# Patient Record
Sex: Male | Born: 1975 | Race: White | Marital: Single
Health system: Southern US, Community
[De-identification: ages and names within clinical notes are randomized; demographics above are authoritative.]

## PROBLEM LIST (undated history)

## (undated) ENCOUNTER — Ambulatory Visit (HOSPITAL_COMMUNITY): Discharge status: Absent without leave | Payer: 59

## (undated) DIAGNOSIS — M25569 Pain in unspecified knee: Secondary | ICD-10-CM

## (undated) DIAGNOSIS — N400 Enlarged prostate without lower urinary tract symptoms: Secondary | ICD-10-CM

## (undated) DIAGNOSIS — M7650 Patellar tendinitis, unspecified knee: Secondary | ICD-10-CM

## (undated) DIAGNOSIS — M542 Cervicalgia: Secondary | ICD-10-CM

## (undated) DIAGNOSIS — J330 Polyp of nasal cavity: Secondary | ICD-10-CM

## (undated) DIAGNOSIS — Z9109 Other allergy status, other than to drugs and biological substances: Secondary | ICD-10-CM

## (undated) DIAGNOSIS — M1990 Unspecified osteoarthritis, unspecified site: Secondary | ICD-10-CM

## (undated) DIAGNOSIS — G2581 Restless legs syndrome: Secondary | ICD-10-CM

## (undated) DIAGNOSIS — K589 Irritable bowel syndrome without diarrhea: Secondary | ICD-10-CM

## (undated) HISTORY — DX: Cervicalgia: M54.2

## (undated) HISTORY — PX: KNEE SURGERY: SHX244

## (undated) HISTORY — DX: Benign prostatic hyperplasia without lower urinary tract symptoms: N40.0

## (undated) HISTORY — DX: Patellar tendinitis, unspecified knee: M76.50

## (undated) HISTORY — DX: Polyp of nasal cavity: J33.0

## (undated) HISTORY — DX: Irritable bowel syndrome, unspecified: K58.9

## (undated) HISTORY — DX: Pain in unspecified knee: M25.569

## (undated) HISTORY — PX: IRRIGATION AND DEBRIDEMENT SEBACEOUS CYST: SHX5255

## (undated) HISTORY — PX: TONSILLECTOMY: SUR1361

---

## 1995-11-11 ENCOUNTER — Emergency Department: Admit: 1995-11-11 | Payer: Self-pay

## 1995-11-18 ENCOUNTER — Emergency Department: Admit: 1995-11-18 | Payer: Self-pay | Admitting: Emergency Medicine

## 1995-11-22 ENCOUNTER — Emergency Department: Admit: 1995-11-22 | Payer: Self-pay | Source: Emergency Department | Admitting: Emergency Medicine

## 1997-04-08 ENCOUNTER — Emergency Department: Admit: 1997-04-08 | Payer: Self-pay

## 2005-10-06 ENCOUNTER — Encounter: Payer: Self-pay | Admitting: Internal Medicine

## 2006-06-06 ENCOUNTER — Ambulatory Visit: Payer: Self-pay | Admitting: Family Medicine

## 2006-06-25 ENCOUNTER — Ambulatory Visit: Payer: Self-pay | Admitting: General Practice

## 2006-08-17 ENCOUNTER — Ambulatory Visit: Payer: Self-pay | Admitting: Family Medicine

## 2007-04-07 ENCOUNTER — Ambulatory Visit: Payer: Self-pay | Admitting: Family Medicine

## 2007-04-07 DIAGNOSIS — M279 Disease of jaws, unspecified: Secondary | ICD-10-CM | POA: Insufficient documentation

## 2007-06-29 HISTORY — PX: VASECTOMY: SHX75

## 2008-01-12 ENCOUNTER — Ambulatory Visit: Payer: Self-pay | Admitting: Internal Medicine

## 2008-01-12 DIAGNOSIS — J309 Allergic rhinitis, unspecified: Secondary | ICD-10-CM | POA: Insufficient documentation

## 2008-01-12 DIAGNOSIS — K589 Irritable bowel syndrome without diarrhea: Secondary | ICD-10-CM

## 2008-04-01 ENCOUNTER — Ambulatory Visit: Payer: Self-pay | Admitting: Family Medicine

## 2008-04-01 LAB — CONVERTED CEMR LAB: Rapid Strep: NEGATIVE

## 2008-07-03 ENCOUNTER — Ambulatory Visit: Payer: Self-pay | Admitting: Family Medicine

## 2008-07-03 DIAGNOSIS — M25519 Pain in unspecified shoulder: Secondary | ICD-10-CM | POA: Insufficient documentation

## 2008-07-03 DIAGNOSIS — M542 Cervicalgia: Secondary | ICD-10-CM | POA: Insufficient documentation

## 2008-07-22 ENCOUNTER — Telehealth: Payer: Self-pay | Admitting: Family Medicine

## 2008-09-05 ENCOUNTER — Ambulatory Visit: Payer: Self-pay | Admitting: Family Medicine

## 2008-12-06 ENCOUNTER — Ambulatory Visit: Payer: Self-pay | Admitting: Family Medicine

## 2008-12-06 DIAGNOSIS — B079 Viral wart, unspecified: Secondary | ICD-10-CM | POA: Insufficient documentation

## 2008-12-06 DIAGNOSIS — M25569 Pain in unspecified knee: Secondary | ICD-10-CM

## 2008-12-06 DIAGNOSIS — M7650 Patellar tendinitis, unspecified knee: Secondary | ICD-10-CM

## 2009-01-15 ENCOUNTER — Ambulatory Visit: Payer: Self-pay | Admitting: Family Medicine

## 2009-01-15 DIAGNOSIS — M62838 Other muscle spasm: Secondary | ICD-10-CM | POA: Insufficient documentation

## 2009-05-05 ENCOUNTER — Ambulatory Visit: Payer: Self-pay | Admitting: Family Medicine

## 2009-05-05 DIAGNOSIS — L0202 Furuncle of face: Secondary | ICD-10-CM

## 2009-05-05 DIAGNOSIS — L0203 Carbuncle of face: Secondary | ICD-10-CM

## 2009-06-18 ENCOUNTER — Ambulatory Visit: Payer: Self-pay | Admitting: Family Medicine

## 2009-06-18 DIAGNOSIS — J069 Acute upper respiratory infection, unspecified: Secondary | ICD-10-CM | POA: Insufficient documentation

## 2009-06-18 DIAGNOSIS — J019 Acute sinusitis, unspecified: Secondary | ICD-10-CM | POA: Insufficient documentation

## 2009-06-18 DIAGNOSIS — J330 Polyp of nasal cavity: Secondary | ICD-10-CM

## 2009-06-26 ENCOUNTER — Telehealth: Payer: Self-pay | Admitting: Family Medicine

## 2009-06-26 ENCOUNTER — Ambulatory Visit: Payer: Self-pay | Admitting: Family Medicine

## 2010-03-05 ENCOUNTER — Ambulatory Visit: Payer: Self-pay | Admitting: Internal Medicine

## 2010-07-28 NOTE — Assessment & Plan Note (Signed)
Summary: COUGH,ST,CONGESTION/CLE   Vital Signs:  Patient profile:   35 year old male Weight:      173.25 pounds Temp:     98.4 degrees F oral Pulse rate:   64 / minute Pulse rhythm:   regular BP sitting:   122 / 78  (left arm) Cuff size:   regular  Vitals Entered By: Selena Batten Dance CMA Duncan Dull) (March 05, 2010 11:35 AM) CC: Cough/congestion/sore throat   History of Present Illness: CC: cough/congestion/ST  1d h/o cough/congestion/ST.  + productive cough.  Sinus pressure/congestion.    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  No fever/chills.  No abd pain, n/v/d.   + sick contacts (both children at home)  No smokers at home.  No h/o allergies or asthma.  No new pets.  Current Medications (verified): 1)  Bl Mens One Daily   Tabs (Multiple Vitamins-Minerals) .... Take 1 Tablet By Mouth Once A Day As Needed  Allergies: 1)  ! 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 Sinuses NT. Eyes:  Conjunctiva clear bilaterally.  Ears:  TMs and canals clear and nonerythem. Nose:  External nasal examination shows no deformity or inflammation. Nasal mucosa are pink and moist without lesions or exudates. Mouth:  Oral mucosa and oropharynx without lesions or exudates.  Teeth in good repair. Neck:  No deformities, masses, or tenderness noted.  no LAD Lungs:  Normal respiratory effort, chest expands symmetrically. Lungs are clear to auscultation, no crackles or wheezes.   Heart:  Normal rate and regular rhythm. S1 and S2 normal without gallop, murmur, click, rub or other extra  sounds. Abdomen:  Bowel sounds positive,abdomen soft and non-tender without masses, organomegaly or hernias noted. Pulses:  2+ rad pulses Extremities:  No clubbing, cyanosis, edema, or deformity noted with normal full range of motion of all joints.   Skin:  Intact without suspicious lesions or rashes   Impression & Recommendations:  Problem # 1:  URI (ICD-465.9) Viral.  Instructed on symptomatic treatment. Call if symptoms persist or worsen.   red flags to return discussed.  The following medications were removed from the medication list:    Cheratussin Ac 100-10 Mg/47ml Syrp (Guaifenesin-codeine) .Marland KitchenMarland KitchenMarland KitchenMarland Kitchen 5 ml every night as needed for cough. His updated medication list for this problem includes:    Cheratussin Ac 100-10 Mg/44ml Syrp (Guaifenesin-codeine) .Marland Kitchen... 1 teaspoon q6 hours as needed cough    Fenesin Ir 400 Mg Tabs (Guaifenesin) .Marland Kitchen... Guaifenesin ir 1 tablet in am and at noon with fluids as needed cough  Complete Medication List: 1)  Bl Mens One Daily Tabs (Multiple vitamins-minerals) .... Take 1 tablet by mouth once a day as needed 2)  Cheratussin Ac 100-10 Mg/59ml Syrp (Guaifenesin-codeine) .Marland Kitchen.. 1 teaspoon q6 hours as needed cough 3)  Fenesin Ir 400 Mg Tabs (Guaifenesin) .... Guaifenesin ir 1 tablet in am and at noon with fluids as needed cough  Patient Instructions: 1)  Sounds like you have a viral upper respiratory infection. 2)  Antibiotics are not needed for this.  Viral infections usually take 7-10 days to resolve.  The cough can last up to 4-6 weeks to go away though. 3)  Use medication as  prescribed: cheratussin cough suppresant for night, guaifenesin IR for morning to help cough out mucous.  For throat you can try ibuprofen up to 600mg  every 6 hours.  For sinus congestion, consider trying neti pot or nasal saline drops to help get mucous out.  4)  Please return if you are not improving as expected, or if you have high fevers (>101.5) or difficulty swallowing. 5)  Call clinic  with questions.  Pleasure to see you today. Prescriptions: FENESIN IR 400 MG TABS (GUAIFENESIN) guaifenesin IR 1 tablet in am and at noon with fluids as needed cough  #30 x 1   Entered and Authorized by:   Eustaquio Boyden  MD   Signed by:   Eustaquio Boyden  MD on 03/05/2010   Method used:   Print then Give to Patient   RxID:   [id number] CHERATUSSIN AC 100-10 MG/5ML SYRP (GUAIFENESIN-CODEINE) 1 teaspoon q6 hours as needed cough  #100cc x 0   Entered and Authorized by:   Eustaquio Boyden  MD   Signed by:   Eustaquio Boyden  MD on 03/05/2010   Method used:   Print then Give to Patient   RxID:   [id number]   Current Allergies (reviewed today): ! PCN

## 2010-11-10 NOTE — Assessment & Plan Note (Signed)
[facility]                                 ON-CALL NOTE   DASAN, HARDMAN                           MRN:          [id number]  DATE:05/05/2009                            DOB:          January 30, 1976    Dr. Patsy Lager is regular doctor and Dr. Milinda Antis on-call.   CHIEF COMPLAINT:  Question about nasal spray.   The patient was seen by Dr. Patsy Lager today for nasal congestion and also  a polyp or sore in his nose.  He is recommended to start Flonase.  He  started the Flonase today and then noticed dry blood in the area of the  polyp in his nose.  It is a little sore but otherwise does not feel any  different.  He was given an antibiotic ointment to use also, he has not  started it yet.  I advised him that Flonase can cause more easy  nosebleed due to the fact that it is topical steroid.  I advised him to  go ahead and use his antibiotic ointment on the sore in his nose for 2  days, then to try the Flonase again.  If it causes a nosebleed or  bleeding again, to call Dr. Cyndie Chime office back for followup and  further advice.  I also invited him to call back if he has any further  questions tonight.     Marne A. Tower, MD  Electronically Signed    MAT/MedQ  DD: 05/05/2009  DT: 05/06/2009  Job #: [id number]
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
## 2011-08-17 ENCOUNTER — Telehealth: Payer: Self-pay | Admitting: Internal Medicine

## 2011-08-17 NOTE — Telephone Encounter (Signed)
Triage Record Num: [id number] Operator: Lesli Albee Patient Name: Shane [name] Call Date & Time: 08/17/2011 8:28:08AM Patient Phone: [phone] PCP: Patient Gender: Male PCP Fax : Patient DOB: [birth year] Practice Name: [facility] Reason for Call: Caller: Tamir/Father; PCP: Hannah Beat T.; CB#: [phone]; ; ; Call regarding Rectal Bleeding; Pt is calling to see if he needs an appt. After each bm for the past couple of months he has had bleeding after bms. Instead of coming in to this office he is asking the nurse if he should see a specialist. RN transferred pt to the office to speak with Aram Beecham. (not sure of protocol for this). Nurse advised pt he should be seen today. Protocol(s) Used: Gastrointestinal Bleeding Recommended Outcome per Protocol: See Provider within 4 hours Reason for Outcome: One or more episodes of rectal bleeding (more than scant) and no symptoms of hypovolemia Care Advice: ~ 08/17/2011 8:47:43AM Page 1 of 1 [id number]

## 2011-08-17 NOTE — Telephone Encounter (Signed)
Was given appt today with Dr Reece Agar

## 2011-08-19 ENCOUNTER — Encounter: Payer: Self-pay | Admitting: Internal Medicine

## 2011-08-19 ENCOUNTER — Encounter: Payer: Self-pay | Admitting: Family Medicine

## 2011-08-19 ENCOUNTER — Ambulatory Visit (INDEPENDENT_AMBULATORY_CARE_PROVIDER_SITE_OTHER): Payer: Managed Care, Other (non HMO) | Admitting: Family Medicine

## 2011-08-19 VITALS — BP 134/80 | HR 64 | Temp 98.3°F | Ht 70.0 in | Wt 176.8 lb

## 2011-08-19 DIAGNOSIS — K921 Melena: Secondary | ICD-10-CM | POA: Insufficient documentation

## 2011-08-19 LAB — HEMOGLOBIN: Hemoglobin: 16 g/dL (ref 13.0–17.0)

## 2011-08-19 LAB — POC HEMOCCULT BLD/STL (OFFICE/1-CARD/DIAGNOSTIC): Fecal Occult Blood, POC: POSITIVE

## 2011-08-19 MED ORDER — HYDROCORTISONE 2.5 % RE CREA: TOPICAL_CREAM | RECTAL | Status: DC

## 2011-08-19 NOTE — Progress Notes (Signed)
Addended by: Baldomero Lamy on: 08/19/2011 10:07 AM   Modules accepted: Orders

## 2011-08-19 NOTE — Assessment & Plan Note (Addendum)
No evidence of bleed on exam noted.  + perianal irritation Anticipate internal hemorrhoidal bleed given story. Refer to GI however given prolonged history of blood in stool. anusol for anal irritation in meantime. rec colace, discussed goal 1 soft stool/day.
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
## 2011-08-19 NOTE — Patient Instructions (Addendum)
Start anusol HC cream for irritation around bottom area. Start colace once daily (stool softener). Since this has been going on for several months, I do think it's best to set you up with stomach doctor for evaluation. Pass by Marion's office to schedule appointment. We will check spot hemoglobin today. If noticing more trouble with voiding, let us know.

## 2011-08-19 NOTE — Progress Notes (Signed)
Addended by: Eustaquio Boyden on: 08/19/2011 09:51 AM   Modules accepted: Orders

## 2011-08-24 ENCOUNTER — Telehealth: Payer: Self-pay | Admitting: *Deleted

## 2011-08-24 DIAGNOSIS — N400 Enlarged prostate without lower urinary tract symptoms: Secondary | ICD-10-CM | POA: Insufficient documentation

## 2011-08-24 NOTE — Telephone Encounter (Signed)
Ok to do - will place order and route to [place]. Prostate was slightly enlarged, + fmhx BPH.

## 2011-08-24 NOTE — Telephone Encounter (Signed)
Patient called and requested Urology appt due to enlarged prostate and family hx of same. He is concerned about it and would like to be evaluated. No current symptoms, but at your exam you said it was enlarged. He prefers Educational psychologist and will wait to hear from [place].

## 2011-08-30 ENCOUNTER — Encounter: Payer: Self-pay | Admitting: Internal Medicine

## 2011-08-31 ENCOUNTER — Encounter: Payer: Self-pay | Admitting: Internal Medicine

## 2011-08-31 ENCOUNTER — Ambulatory Visit (INDEPENDENT_AMBULATORY_CARE_PROVIDER_SITE_OTHER): Payer: Managed Care, Other (non HMO) | Admitting: Internal Medicine

## 2011-08-31 VITALS — BP 110/60 | HR 64 | Ht 70.0 in | Wt 180.0 lb

## 2011-08-31 DIAGNOSIS — K625 Hemorrhage of anus and rectum: Secondary | ICD-10-CM

## 2011-08-31 NOTE — Patient Instructions (Signed)
You have been scheduled for a Flex sig with propofol. Please follow written instructions given to you at your visit today.  Please pick up your prep kit at the pharmacy within the next 1-3 days.
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
## 2011-09-14 ENCOUNTER — Telehealth: Payer: Self-pay | Admitting: Internal Medicine

## 2011-09-14 NOTE — Telephone Encounter (Signed)
[place] Did he say why? I saw him in clinic recently and we discussed the test.

## 2011-09-14 NOTE — Telephone Encounter (Signed)
I do not think you need to call him. I just wanted to be sure, as we discussed in clinic, that my recommendation was for flex sig I will alert Aram Beecham, in case, she hears from him No charge.

## 2011-09-14 NOTE — Telephone Encounter (Signed)
Dr. Rhea Belton, The only reason he gave was "that he did not feel he needed the procedure right now" he said he would call back when he felt like he needed to reschedule, Would you like me to call him back for more information?

## 2011-09-14 NOTE — Telephone Encounter (Signed)
Spoke with pt to ask about his condition. Pt reports he has bleeding every once in a while, but he thinks it's d/t hemorrhoids. He reports he take a stool softener every once in a while when he feels he needs it. Pt reports he just doesn't feel as though he needs the procedure at this time and he will call for further problems.

## 2011-09-16 ENCOUNTER — Other Ambulatory Visit: Payer: Managed Care, Other (non HMO) | Admitting: Internal Medicine

## 2011-10-11 ENCOUNTER — Ambulatory Visit (AMBULATORY_SURGERY_CENTER): Payer: Managed Care, Other (non HMO) | Admitting: Internal Medicine

## 2011-10-11 ENCOUNTER — Encounter: Payer: Self-pay | Admitting: Internal Medicine

## 2011-10-11 VITALS — BP 144/89 | HR 97 | Temp 96.3°F | Resp 15 | Ht 70.0 in | Wt 180.0 lb

## 2011-10-11 DIAGNOSIS — K921 Melena: Secondary | ICD-10-CM

## 2011-10-11 DIAGNOSIS — K625 Hemorrhage of anus and rectum: Secondary | ICD-10-CM

## 2011-10-11 MED ORDER — HYDROCORTISONE ACETATE 25 MG RE SUPP: 25.0000 mg | Freq: Two times a day (BID) | RECTAL | Status: AC

## 2011-10-11 MED ORDER — SODIUM CHLORIDE 0.9 % IV SOLN: 500.0000 mL | INTRAVENOUS | Status: DC

## 2011-10-11 NOTE — Op Note (Signed)
[facility] [address] [place], [place]  [address]  FLEXIBLE SIGMOIDOSCOPY PROCEDURE REPORT  PATIENT:  Shane [name], Shane [name]  MR#:  [id number] BIRTHDATE:  [birth year], 35 yrs. old  GENDER:  male ENDOSCOPIST:  Carie Caddy. Valorie Mcgrory, MD  PROCEDURE DATE:  10/11/2011 PROCEDURE:  Flexible Sigmoidoscopy, diagnostic ASA CLASS:  Class I INDICATIONS:  rectal bleeding MEDICATIONS:   MAC sedation, administered by CRNA, propofol (Diprivan) 250 mg IV  DESCRIPTION OF PROCEDURE:   After the risks benefits and alternatives of the procedure were thoroughly explained, informed consent was obtained.  Digital rectal exam was performed and revealed no rectal masses.   The [id number] endoscope was introduced through the anus and advanced to the descending colon, without limitations.  The quality of the prep was adequate. The instrument was then slowly withdrawn as the mucosa was fully examined. <<PROCEDUREIMAGES>>  Endoscopically normal mucosa without inflammation, edema or ulceration from the rectum to descending colon.   Retroflexed views in the rectum revealed hypertrophied anal papillae.    The scope was then withdrawn from the patient and the procedure terminated.  COMPLICATIONS:  None  ENDOSCOPIC IMPRESSION: 1) Normal mucosa in the rectum to descending colon 2) Hypertrophied anal papillae  RECOMMENDATIONS: 1) If bright red rectal bleeding returns, I recommend a trial of hydrocortisone suppositories twice daily. 2) Contact the office for any new or concerning symptoms.  Carie Caddy. Rhea Belton, MD  CC:  Karie Schwalbe, MD The Patient  n. eSIGNED:   Carie Caddy. Weylin Plagge at 10/11/2011 04:17 PM  Alphonse Guild, [phone]

## 2011-10-11 NOTE — Progress Notes (Signed)
Patient did not experience any of the following events: a burn prior to discharge; a fall within the facility; wrong site/side/patient/procedure/implant event; or a hospital transfer or hospital admission upon discharge from the facility. (G8907) Patient did not have preoperative order for IV antibiotic SSI prophylaxis. (G8918)  

## 2011-10-11 NOTE — Patient Instructions (Signed)
YOU HAD AN ENDOSCOPIC PROCEDURE TODAY AT THE [facility]: Refer to the procedure report that was given to you for any specific questions about what was found during the examination.  If the procedure report does not answer your questions, please call your gastroenterologist to clarify.  If you requested that your care partner not be given the details of your procedure findings, then the procedure report has been included in a sealed envelope for you to review at your convenience later.  YOU SHOULD EXPECT: Some feelings of bloating in the abdomen. Passage of more gas than usual.  Walking can help get rid of the air that was put into your GI tract during the procedure and reduce the bloating. If you had a lower endoscopy (such as a colonoscopy or flexible sigmoidoscopy) you may notice spotting of blood in your stool or on the toilet paper. If you underwent a bowel prep for your procedure, then you may not have a normal bowel movement for a few days.  DIET: Your first meal following the procedure should be a light meal and then it is ok to progress to your normal diet.  A half-sandwich or bowl of soup is an example of a good first meal.  Heavy or fried foods are harder to digest and may make you feel nauseous or bloated.  Likewise meals heavy in dairy and vegetables can cause extra gas to form and this can also increase the bloating.  Drink plenty of fluids but you should avoid alcoholic beverages for 24 hours.  ACTIVITY: Your care partner should take you home directly after the procedure.  You should plan to take it easy, moving slowly for the rest of the day.  You can resume normal activity the day after the procedure however you should NOT DRIVE or use heavy machinery for 24 hours (because of the sedation medicines used during the test).    SYMPTOMS TO REPORT IMMEDIATELY: A gastroenterologist can be reached at any hour.  During normal business hours, 8:30 AM to 5:00 PM Monday through Friday,  call [phone].  After hours and on weekends, please call the GI answering service at [phone] who will take a message and have the physician on call contact you.   Following lower endoscopy (colonoscopy or flexible sigmoidoscopy):  Excessive amounts of blood in the stool  Significant tenderness or worsening of abdominal pains  Swelling of the abdomen that is new, acute  Fever of 100F or higher    FOLLOW UP: If any biopsies were taken you will be contacted by phone or by letter within the next 1-3 weeks.  Call your gastroenterologist if you have not heard about the biopsies in 3 weeks.  Our staff will call the home number listed on your records the next business day following your procedure to check on you and address any questions or concerns that you may have at that time regarding the information given to you following your procedure. This is a courtesy call and so if there is no answer at the home number and we have not heard from you through the emergency physician on call, we will assume that you have returned to your regular daily activities without incident.  SIGNATURES/CONFIDENTIALITY: You and/or your care partner have signed paperwork which will be entered into your electronic medical record.  These signatures attest to the fact that that the information above on your After Visit Summary has been reviewed and is understood.  Full responsibility of the confidentiality   of this discharge information lies with you and/or your care-partner.     

## 2011-10-12 ENCOUNTER — Encounter: Payer: Self-pay | Admitting: Family Medicine

## 2011-10-12 ENCOUNTER — Telehealth: Payer: Self-pay | Admitting: *Deleted

## 2011-10-12 NOTE — Telephone Encounter (Signed)
  Follow up Call-  Call back number 10/11/2011  Post procedure Call Back phone  # [phone]  Permission to leave phone message Yes     Patient questions:  Do you have a fever, pain , or abdominal swelling? no Pain Score  0 *  Have you tolerated food without any problems? yes  Have you been able to return to your normal activities? yes  Do you have any questions about your discharge instructions: Diet   no Medications  no Follow up visit  no  Do you have questions or concerns about your Care? no  Actions: * If pain score is 4 or above: No action needed, pain <4.

## 2012-10-27 ENCOUNTER — Ambulatory Visit: Payer: Self-pay | Admitting: Orthopedic Surgery

## 2012-11-15 ENCOUNTER — Ambulatory Visit: Payer: Self-pay | Admitting: Orthopedic Surgery

## 2012-11-15 LAB — BASIC METABOLIC PANEL
Anion Gap: 3 — ABNORMAL LOW (ref 7–16)
BUN: 16 mg/dL (ref 7–18)
Calcium, Total: 8.7 mg/dL (ref 8.5–10.1)
Co2: 29 mmol/L (ref 21–32)
Glucose: 96 mg/dL (ref 65–99)
Osmolality: 279 (ref 275–301)
Sodium: 139 mmol/L (ref 136–145)

## 2012-11-15 LAB — CBC
MCH: 30.8 pg (ref 26.0–34.0)
MCHC: 34.7 g/dL (ref 32.0–36.0)
RBC: 5 10*6/uL (ref 4.40–5.90)

## 2012-11-15 LAB — PROTIME-INR: INR: 1

## 2012-11-22 ENCOUNTER — Ambulatory Visit: Payer: Self-pay | Admitting: Orthopedic Surgery

## 2012-11-25 ENCOUNTER — Emergency Department: Payer: Self-pay | Admitting: Emergency Medicine

## 2014-04-01 ENCOUNTER — Ambulatory Visit: Payer: Self-pay | Admitting: Unknown Physician Specialty

## 2014-04-03 DIAGNOSIS — S83207D Unspecified tear of unspecified meniscus, current injury, left knee, subsequent encounter: Secondary | ICD-10-CM | POA: Insufficient documentation

## 2014-04-12 ENCOUNTER — Ambulatory Visit: Payer: Self-pay | Admitting: Unknown Physician Specialty

## 2014-04-22 DIAGNOSIS — Z9889 Other specified postprocedural states: Secondary | ICD-10-CM | POA: Insufficient documentation

## 2014-07-02 DIAGNOSIS — R258 Other abnormal involuntary movements: Secondary | ICD-10-CM | POA: Insufficient documentation
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
## 2014-10-18 NOTE — Consult Note (Signed)
Brief Consult Note: Diagnosis: Right hip/thigh pain status post right knee arthroscopy on 11/22/12.   Patient was seen by consultant.   Comments: Patient had called me at home at 6:30AM this morning with complaints of severe hip/thigh pain not controlled by hydrocodone.  Patient states he could not get comfortable and did not sleep until 4:00AM when he finally sat in a recliner.  The symptoms began the day after surgery without injury.  The intermittent, deep aching pain is over  ASIS extending distally to the anterior midthigh.  He denies any significant right knee pain currently.  Attempted hip flexion exacerbate the pain.  I told him to come to the ER for evaluation this AM.  His wife is with him in the ER.  Patient states his pain is better now that he received dilaudid.  On exam, patient's skin is intact.  He has no erythema or ecchymosis or edema in the RLE.  His knee has a small effusion which is expected post-op.  His incisions are C/D/I.  His post-op bandage has been removed.  He has no knee tenderness but is tender over the anterior midthigh overlying the path of the rectis femoris extending proximally.  He has no pain with IR or ER of the hip.  He is NVI except for mild paresthesias over the medial malleous which is states is very faint.  Patient is unable to flex hip/knee actively because of pain, but he can passively have his hip flexed and extended to 90 degrees.  Further flexion is painful.  He has no calf tenderness and thigh and leg comparments are soft and compressible.  I reviewed radiographs of the right hip which demonstrate no femur fracture, cortical reaction or hip dislocation.  There is a small calcification near lateral acetabulum, which may be calcification of soft tissue or possible small avulsion fragment.  Clinically diagnosis seems consistent with hip flexor/quad strain although the patient's pain seems out of proportion to this diagnosis.  Ultrasound was performed of the RLE which  is negative for DVT.  Patient will be given percocet and flexoril x 20 tablets each. Patient happy with plan.  Follow up as scheduled in my office within 1 week or contact my office if sx worsen.  Electronic Signatures: Thornton Park (MD)  (Signed 31-May-14 10:42)  Authored: Brief Consult Note   Last Updated: 31-May-14 10:42 by Thornton Park (MD)

## 2014-10-28 ENCOUNTER — Ambulatory Visit
Admission: EM | Admit: 2014-10-28 | Discharge: 2014-10-28 | Disposition: A | Discharge status: Home / Self Care | Payer: Worker's Compensation

## 2014-10-28 NOTE — ED Notes (Signed)
Urine obtained for [facility] with Chain of Custody

## 2014-10-28 NOTE — ED Notes (Signed)
For UDS Chain of Custody Only

## 2014-11-25 ENCOUNTER — Emergency Department
Admission: EM | Admit: 2014-11-25 | Discharge: 2014-11-25 | Disposition: A | Discharge status: Home / Self Care | Payer: Worker's Compensation | Attending: Emergency Medicine | Admitting: Emergency Medicine

## 2014-11-25 ENCOUNTER — Encounter: Payer: Self-pay | Admitting: Emergency Medicine

## 2014-11-25 DIAGNOSIS — Z0389 Encounter for observation for other suspected diseases and conditions ruled out: Secondary | ICD-10-CM | POA: Insufficient documentation

## 2014-11-25 DIAGNOSIS — Z880 Allergy status to penicillin: Secondary | ICD-10-CM | POA: Diagnosis not present

## 2014-11-25 DIAGNOSIS — Z87891 Personal history of nicotine dependence: Secondary | ICD-10-CM | POA: Insufficient documentation

## 2014-11-25 DIAGNOSIS — Y998 Other external cause status: Secondary | ICD-10-CM | POA: Insufficient documentation

## 2014-11-25 DIAGNOSIS — Y9389 Activity, other specified: Secondary | ICD-10-CM | POA: Insufficient documentation

## 2014-11-25 DIAGNOSIS — Z79899 Other long term (current) drug therapy: Secondary | ICD-10-CM | POA: Insufficient documentation

## 2014-11-25 DIAGNOSIS — Z0000 Encounter for general adult medical examination without abnormal findings: Secondary | ICD-10-CM

## 2014-11-25 DIAGNOSIS — Z041 Encounter for examination and observation following transport accident: Secondary | ICD-10-CM | POA: Diagnosis present

## 2014-11-25 DIAGNOSIS — Y92410 Unspecified street and highway as the place of occurrence of the external cause: Secondary | ICD-10-CM | POA: Diagnosis not present

## 2014-11-25 NOTE — ED Notes (Signed)
No pain
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
## 2014-11-25 NOTE — ED Notes (Signed)
Spoke with Gabrielle Dare, requiring only UDS. UDS sent to lab.
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
## 2014-12-06 NOTE — ED Provider Notes (Signed)
Medical screening examination/treatment/procedure(s) were performed by non-physician practitioner and as supervising physician I was immediately available for consultation/collaboration.    Earleen Newport, MD 12/06/14 [phone]

## 2015-01-13 ENCOUNTER — Other Ambulatory Visit: Payer: Self-pay

## 2015-01-13 ENCOUNTER — Encounter: Payer: Self-pay | Admitting: Emergency Medicine

## 2015-01-13 ENCOUNTER — Ambulatory Visit
Admission: EM | Admit: 2015-01-13 | Discharge: 2015-01-13 | Disposition: A | Discharge status: Home / Self Care | Payer: 59 | Attending: Internal Medicine | Admitting: Internal Medicine

## 2015-01-13 DIAGNOSIS — R079 Chest pain, unspecified: Secondary | ICD-10-CM | POA: Insufficient documentation

## 2015-01-13 DIAGNOSIS — G4700 Insomnia, unspecified: Secondary | ICD-10-CM

## 2015-01-13 DIAGNOSIS — A084 Viral intestinal infection, unspecified: Secondary | ICD-10-CM | POA: Diagnosis not present

## 2015-01-13 DIAGNOSIS — R002 Palpitations: Secondary | ICD-10-CM | POA: Diagnosis not present

## 2015-01-13 DIAGNOSIS — Z87891 Personal history of nicotine dependence: Secondary | ICD-10-CM | POA: Diagnosis not present

## 2015-01-13 HISTORY — DX: Restless legs syndrome: G25.81

## 2015-01-13 HISTORY — DX: Other allergy status, other than to drugs and biological substances: Z91.09

## 2015-01-13 HISTORY — DX: Unspecified osteoarthritis, unspecified site: M19.90

## 2015-01-13 MED ORDER — NON FORMULARY: 150.0000 mg | Freq: Once | Status: DC

## 2015-01-13 MED ORDER — GI COCKTAIL ~~LOC~~: 30.0000 mL | Freq: Once | ORAL | Status: DC

## 2015-01-13 MED ORDER — RANITIDINE HCL 150 MG PO TABS: 150.0000 mg | ORAL_TABLET | Freq: Two times a day (BID) | ORAL | Status: DC

## 2015-01-13 NOTE — Discharge Instructions (Signed)
Viral Gastroenteritis °Viral gastroenteritis is also known as stomach flu. This condition affects the stomach and intestinal tract. It can cause sudden diarrhea and vomiting. The illness typically lasts 3 to 8 days. Most people develop an immune response that eventually gets rid of the virus. While this natural response develops, the virus can make you quite ill. °CAUSES  °Many different viruses can cause gastroenteritis, such as rotavirus or noroviruses. You can catch one of these viruses by consuming contaminated food or water. You may also catch a virus by sharing utensils or other personal items with an infected person or by touching a contaminated surface. °SYMPTOMS  °The most common symptoms are diarrhea and vomiting. These problems can cause a severe loss of body fluids (dehydration) and a body salt (electrolyte) imbalance. Other symptoms may include: °· Fever. °· Headache. °· Fatigue. °· Abdominal pain. °DIAGNOSIS  °Your caregiver can usually diagnose viral gastroenteritis based on your symptoms and a physical exam. A stool sample may also be taken to test for the presence of viruses or other infections. °TREATMENT  °This illness typically goes away on its own. Treatments are aimed at rehydration. The most serious cases of viral gastroenteritis involve vomiting so severely that you are not able to keep fluids down. In these cases, fluids must be given through an intravenous line (IV). °HOME CARE INSTRUCTIONS  °· Drink enough fluids to keep your urine clear or pale yellow. Drink small amounts of fluids frequently and increase the amounts as tolerated. °· Ask your caregiver for specific rehydration instructions. °· Avoid: °¨ Foods high in sugar. °¨ Alcohol. °¨ Carbonated drinks. °¨ Tobacco. °¨ Juice. °¨ Caffeine drinks. °¨ Extremely hot or cold fluids. °¨ Fatty, greasy foods. °¨ Too much intake of anything at one time. °¨ Dairy products until 24 to 48 hours after diarrhea stops. °· You may consume probiotics.  Probiotics are active cultures of beneficial bacteria. They may lessen the amount and number of diarrheal stools in adults. Probiotics can be found in yogurt with active cultures and in supplements. °· Wash your hands well to avoid spreading the virus. °· Only take over-the-counter or prescription medicines for pain, discomfort, or fever as directed by your caregiver. Do not give aspirin to children. Antidiarrheal medicines are not recommended. °· Ask your caregiver if you should continue to take your regular prescribed and over-the-counter medicines. °· Keep all follow-up appointments as directed by your caregiver. °SEEK IMMEDIATE MEDICAL CARE IF:  °· You are unable to keep fluids down. °· You do not urinate at least once every 6 to 8 hours. °· You develop shortness of breath. °· You notice blood in your stool or vomit. This may look like coffee grounds. °· You have abdominal pain that increases or is concentrated in one small area (localized). °· You have persistent vomiting or diarrhea. °· You have a fever. °· The patient is a child younger than 3 months, and he or she has a fever. °· The patient is a child older than 3 months, and he or she has a fever and persistent symptoms. °· The patient is a child older than 3 months, and he or she has a fever and symptoms suddenly get worse. °· The patient is a baby, and he or she has no tears when crying. °MAKE SURE YOU:  °· Understand these instructions. °· Will watch your condition. °· Will get help right away if you are not doing well or get worse. °Document Released: 06/14/2005 Document Revised: 09/06/2011 Document Reviewed: 03/31/2011 °  ExitCare Patient Information 2015 [place]. This information is not intended to replace advice given to you by your health care provider. Make sure you discuss any questions you have with your health care provider. Pericarditis Pericarditis is swelling (inflammation) of the pericardium. The pericardium is a thin,  double-layered, fluid-filled tissue sac that surrounds the heart. The purpose of the pericardium is to contain the heart in the chest cavity and keep the heart from overexpanding. Different types of pericarditis can occur, such as:  Acute pericarditis. Inflammation can develop suddenly in acute pericarditis.  Chronic pericarditis. Inflammation develops gradually and is long-lasting in chronic pericarditis.  Constrictive pericarditis. In this type of pericarditis, the layers of the pericardium stiffen and develop scar tissue. The scar tissue thickens and sticks together. This makes it difficult for the heart to pump and work as it normally does. CAUSES  Pericarditis can be caused from different conditions, such as:  A bacterial, fungal or viral infection.  After a heart attack (myocardial infarction).  After open-heart surgery (coronary bypass graft surgery).  Auto-immune conditions such as lupus, rheumatoid arthritis or scleroderma.  Kidney failure.  Low thyroid condition (hypothyroidism).  Cancer from another part of the body that has spread (metastasized) to the pericardium.  Chest injury or trauma.  After radiation treatment.  Certain medicines. SYMPTOMS  Symptoms of pericarditis can include:  Chest pain. Chest pain symptoms may increase when laying down and may be relieved when sitting up and leaning forward.  A chronic, dry cough.  Heart palpitations. These may feel like rapid, fluttering or pounding heart beats.  Chest pain may be worse when swallowing.  Dizziness or fainting.  Tiredness, fatigue or lethargy.  Fever. DIAGNOSIS  Pericarditis is diagnosed by the following:  A physical exam. A heart sound called a pericardial friction rub may be heard when your caregiver listens to your heart.  Blood work. Blood may be drawn to check for an infection and to look at your blood chemistry.  Electrocardiography. During electrocardiography your heart's electrical  activity is monitored and recorded with a tracing on paper (electrocardiogram [ECG]).  Echocardiography.  Computed tomography (CT).  Magnetic resonance image (MRI). TREATMENT  To treat pericarditis, it is important to know the cause of it. The cause of pericarditis determines the treatment.   If the cause of pericarditis is due to an infection, treatment is based on the type of infection. If an infection is suspected in the pericardial fluid, a procedure called a pericardial fluid culture and biopsy may be done. This takes a sample of the pericardial fluid. The sample is sent to a lab which runs tests on the pericardial fluid to check for an infection.  If the autoimmune disease is the cause, treatment of the autoimmune condition will help improve the pericarditis.  If the cause of pericarditis is not known, anti-inflammatory medicines may be used to help decrease the inflammation.  Surgery may be needed. The following are types of surgeries or procedures that may be done to treat pericarditis:  Pericardial window. A pericardial window makes a cut (incision) into the pericardial sac. This allows excess fluid in the pericardium to drain.  Pericardiocentesis. A pericardiocentesis is also known as a pericardial tap. This procedure uses a needle that is guided by X-ray to drain (aspirate) excess fluid from the pericardium.  Pericardiectomy. A pericardiectomy removes part or all of the pericardium. HOME CARE INSTRUCTIONS   Do not smoke. If you smoke, quit. Your caregiver can help you quit smoking.  Maintain  a healthy weight.  Follow an exercise program as told by your caregiver.  If you drink alcohol, do so in moderation.  Eat a heart healthy diet. A registered dietician can help you learn about healthy food choices.  Keep a list of all your medicines with you at all times. Include the name, dose, how often it is taken and how it is taken. SEEK IMMEDIATE MEDICAL CARE IF:   You have  chest pain or feelings of chest pressure.  You have sweating (diaphoresis) when at rest.  You have irregular heartbeats (palpitations).  You have rapid, racing heart beats.  You have unexplained fainting episodes.  You feel sick to your stomach (nausea) or vomiting without cause.  You have unexplained weakness. If you develop any of the symptoms which originally made you seek care, call for local emergency medical help. Do not drive yourself to the hospital. Document Released: 12/08/2000 Document Revised: 09/06/2011 Document Reviewed: 06/16/2011 [facility] Patient Information 2015 [place], [place]. This information is not intended to replace advice given to you by your health care provider. Make sure you discuss any questions you have with your health care provider. Palpitations A palpitation is the feeling that your heartbeat is irregular or is faster than normal. It may feel like your heart is fluttering or skipping a beat. Palpitations are usually not a serious problem. However, in some cases, you may need further medical evaluation. CAUSES  Palpitations can be caused by:  Smoking.  Caffeine or other stimulants, such as diet pills or energy drinks.  Alcohol.  Stress and anxiety.  Strenuous physical activity.  Fatigue.  Certain medicines.  Heart disease, especially if you have a history of irregular heart rhythms (arrhythmias), such as atrial fibrillation, atrial flutter, or supraventricular tachycardia.  An improperly working pacemaker or defibrillator. DIAGNOSIS  To find the cause of your palpitations, your health care provider will take your medical history and perform a physical exam. Your health care provider may also have you take a test called an ambulatory electrocardiogram (ECG). An ECG records your heartbeat patterns over a 24-hour period. You may also have other tests, such as:  Transthoracic echocardiogram (TTE). During echocardiography, sound waves are used to  evaluate how blood flows through your heart.  Transesophageal echocardiogram (TEE).  Cardiac monitoring. This allows your health care provider to monitor your heart rate and rhythm in real time.  Holter monitor. This is a portable device that records your heartbeat and can help diagnose heart arrhythmias. It allows your health care provider to track your heart activity for several days, if needed.  Stress tests by exercise or by giving medicine that makes the heart beat faster. TREATMENT  Treatment of palpitations depends on the cause of your symptoms and can vary greatly. Most cases of palpitations do not require any treatment other than time, relaxation, and monitoring your symptoms. Other causes, such as atrial fibrillation, atrial flutter, or supraventricular tachycardia, usually require further treatment. HOME CARE INSTRUCTIONS   Avoid:  Caffeinated coffee, tea, soft drinks, diet pills, and energy drinks.  Chocolate.  Alcohol.  Stop smoking if you smoke.  Reduce your stress and anxiety. Things that can help you relax include:  A method of controlling things in your body, such as your heartbeats, with your mind (biofeedback).  Yoga.  Meditation.  Physical activity such as swimming, jogging, or walking.  Get plenty of rest and sleep. SEEK MEDICAL CARE IF:   You continue to have a fast or irregular heartbeat beyond 24 hours.  Your palpitations occur more often. SEEK IMMEDIATE MEDICAL CARE IF:  You have chest pain or shortness of breath.  You have a severe headache.  You feel dizzy or you faint. MAKE SURE YOU:  Understand these instructions.  Will watch your condition.  Will get help right away if you are not doing well or get worse. Document Released: 06/11/2000 Document Revised: 06/19/2013 Document Reviewed: 08/13/2011 [facility] Patient Information 2015 [place], [place]. This information is not intended to replace advice given to you by your health care  provider. Make sure you discuss any questions you have with your health care provider.
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
## 2015-01-13 NOTE — ED Notes (Signed)
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 Pt reports as constant, also felt like he had to go to the bathroom but didn't have diarrhea. Had a few episodes of dizziness. Pt reports abdominal pain and some dizziness when in wr.

## 2015-06-10 ENCOUNTER — Encounter: Payer: Self-pay | Admitting: Physician Assistant

## 2015-06-10 ENCOUNTER — Ambulatory Visit: Payer: Self-pay | Admitting: Physician Assistant

## 2015-06-10 VITALS — BP 110/79 | HR 69 | Temp 98.3°F

## 2015-06-10 DIAGNOSIS — M549 Dorsalgia, unspecified: Secondary | ICD-10-CM

## 2015-06-10 MED ORDER — BACLOFEN 10 MG PO TABS
10.0000 mg | ORAL_TABLET | Freq: Three times a day (TID) | ORAL | Status: DC
Start: 2015-06-10 — End: 2015-06-10

## 2015-06-10 MED ORDER — BACLOFEN 10 MG PO TABS: 10.0000 mg | ORAL_TABLET | Freq: Three times a day (TID) | ORAL | Status: DC

## 2015-06-10 NOTE — Progress Notes (Signed)
S:  C/o low back pain for 1 day, felt his back go out while changing his sons sheets, pain is worse with movement, increased with bending over, denies numbness, tingling, or changes in bowel/urinary habits,  Using otc meds without relief Remainder ros neg; ?if needs work note as missed Monday due to GI bug, no sx of "gi bug" now  O:  Vitals wnl, nad, lungs c t a, cv rrr, spine nontender, muscles in lower back spasmed , decreased rom with twisting, able to bend and touch toes,  Neg slr, pt walks without difficulty, no foot drop noted, n/v intact  A: acute back pain, muscle spasms  P: baclofen 61m tid, otc nsaids;  use wet heat followed by ice, stretches, return to clinic if not better in 3 t 5 days, return earlier if worsening, rx meds:

## 2015-09-21 ENCOUNTER — Emergency Department: Admission: EM | Admit: 2015-09-21 | Payer: Self-pay | Source: Home / Self Care

## 2015-09-21 NOTE — ED Notes (Signed)
Bed: GR12  Expected date: 09/21/15  Expected time: 10:16 PM  Means of arrival: [id number] - [place]  Comments:  Nausea ,hypertension hyperglycemia

## 2016-05-19 ENCOUNTER — Ambulatory Visit
Admission: EM | Admit: 2016-05-19 | Discharge: 2016-05-19 | Disposition: A | Discharge status: Home / Self Care | Payer: Worker's Compensation | Attending: Emergency Medicine | Admitting: Emergency Medicine

## 2016-05-19 ENCOUNTER — Ambulatory Visit (INDEPENDENT_AMBULATORY_CARE_PROVIDER_SITE_OTHER): Payer: Worker's Compensation

## 2016-05-19 ENCOUNTER — Encounter: Payer: Self-pay | Admitting: *Deleted

## 2016-05-19 DIAGNOSIS — G8929 Other chronic pain: Secondary | ICD-10-CM | POA: Diagnosis not present

## 2016-05-19 DIAGNOSIS — M545 Low back pain: Secondary | ICD-10-CM

## 2016-05-19 DIAGNOSIS — T148XXA Other injury of unspecified body region, initial encounter: Secondary | ICD-10-CM

## 2016-05-19 MED ORDER — CYCLOBENZAPRINE HCL 10 MG PO TABS: 10.0000 mg | ORAL_TABLET | Freq: Two times a day (BID) | ORAL | 0 refills | Status: AC

## 2016-05-19 MED ORDER — TRAMADOL HCL 50 MG PO TABS: 50.0000 mg | ORAL_TABLET | Freq: Four times a day (QID) | ORAL | 0 refills | Status: DC | PRN

## 2016-05-19 NOTE — ED Triage Notes (Signed)
While at work today, pt attempting to lift an approx 50lb object, sudden onset low back pain. Denies radiation or other symptoms.

## 2016-05-19 NOTE — Discharge Instructions (Signed)
Please take tramadol as needed for pain. Please take the muscle relaxer twice daily for the next 5 days. Apply warm/heat to the area. Rest. May return to work on Friday but on light duty.   You need to follow up with Shane [name] at the address above in 5 days for re-evaluation to see if you can return back to work on full duty. Please call today to schedule an appointment for next week.

## 2016-05-19 NOTE — ED Provider Notes (Signed)
CSN: [id number]     Arrival date & time 05/19/16  1420 History   First MD Initiated Contact with Patient 05/19/16 1452     Chief Complaint  Patient presents with  . Back Pain   (Consider location/radiation/quality/duration/timing/severity/associated sxs/prior Treatment) This is a worker's comp case. Mr. 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 Patient reports that he lifted the heavy object half way up and that is when he noticed the back pain and he had to immediately dropped the bag. Patient reports the pain to be currently 4/10 at rest. He reports able to walk and drive here but it is very difficult/painful to stand up from sitting position or sit down from a standing position. He reports the pain to be a "shooting pain" but non-radiating. He denies numbness or tingling sensation. 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 Patient also reports that he has chronic neck pain and chronic low back pain as well. Patient normally takes ibuprofen and/or tylenol at home for his pain. He reports his joints are stiff when he wakes up in the morning. He have tried to put cold pack on it with no improvement.       Past Medical History:  Diagnosis Date  . Allergic rhinitis   . Arthritis   . BPH (benign prostatic hyperplasia)    started on saw palmetto ([facility] Dr. Yves Dill)  . Cervicalgia   . Environmental allergies   . Irritable bowel syndrome   . Pain in joint, lower leg   . Patellar tendinitis   . Polyp of nasal cavity   . Restless leg    Past Surgical History:  Procedure Laterality Date  . IRRIGATION AND DEBRIDEMENT SEBACEOUS CYST    . TONSILLECTOMY    . VASECTOMY  2009   Family History  Problem Relation Age of Onset  . Hyperlipidemia Father   . Cardiomyopathy Father     hypertrophic  . Irritable  bowel syndrome Father   . Goiter Mother   . Mitral valve prolapse Sister   . Thyroid disease      maternal side  . Coronary artery disease      paternal side (coal miners)  . Crohn's disease      uncle and cousin  . Crohn's disease Paternal Uncle   . Cancer Neg Hx    Social History  Substance Use Topics  . Smoking status: Former Smoker    Quit date: 07/03/2003  . Smokeless tobacco: Never Used  . Alcohol use Yes     Comment: Occasional    Review of Systems  Constitutional: Negative for chills, fatigue and fever.  Respiratory: Negative for cough and shortness of breath.   Cardiovascular: Negative for chest pain and palpitations.  Gastrointestinal: Negative for abdominal pain, diarrhea, nausea and vomiting.  Musculoskeletal:       +back pain, shoulder pain, knee pain. Denies numbness or tingling sensation  Neurological: Negative for dizziness and headaches.    Allergies  Penicillin g and Penicillins  Home Medications   Prior to Admission medications   Medication Sig Start Date End Date Taking? Authorizing Provider  Multiple Vitamin (MULTIVITAMIN) tablet Take 1 tablet by mouth daily.   Yes Historical Provider, MD  SAW PALMETTO, SERENOA REPENS, PO Take by mouth.   Yes Historical Provider,  MD  baclofen (LIORESAL) 10 MG tablet Take 1 tablet (10 mg total) by mouth 3 (three) times daily. 06/10/15   Versie Starks, PA-C  cetirizine (ZYRTEC) 10 MG tablet Take 10 mg by mouth daily.    Historical Provider, MD  cyclobenzaprine (FLEXERIL) 10 MG tablet Take 1 tablet (10 mg total) by mouth 2 (two) times daily. 05/19/16 05/24/16  Barry Dienes, NP  gabapentin (NEURONTIN) 400 MG capsule Take 400 mg by mouth daily.    Historical Provider, MD  glucosamine-chondroitin 500-400 MG tablet Take 1 tablet by mouth daily.    Historical Provider, MD  hydrocortisone (ANUSOL-HC) 2.5 % rectal cream Apply to affected area bid for 1 week. Patient not taking: Reported on 05/19/2016 08/19/11   Ria Bush,  MD  loratadine (CLARITIN) 10 MG tablet Take 10 mg by mouth daily.    Historical Provider, MD  naproxen sodium (ANAPROX) 220 MG tablet Take 220 mg by mouth 2 (two) times daily with a meal.    Historical Provider, MD  ranitidine (ZANTAC) 150 MG tablet Take 1 tablet (150 mg total) by mouth 2 (two) times daily. Patient not taking: Reported on 06/10/2015 01/13/15   Olen Cordial, NP  traMADol (ULTRAM) 50 MG tablet Take 1 tablet (50 mg total) by mouth every 6 (six) hours as needed. 05/19/16   Barry Dienes, NP   Meds Ordered and Administered this Visit  Medications - No data to display  BP 113/66 (BP Location: Left Arm)   Pulse 72   Temp 98 F (36.7 C) (Oral)   Resp 16   Ht 5' 10"  (1.778 m)   Wt 160 lb (72.6 kg)   SpO2 98%   BMI 22.96 kg/m  No data found.   Physical Exam  Constitutional: He appears well-developed and well-nourished.  Cardiovascular: Normal rate, regular rhythm and normal heart sounds.   Pulmonary/Chest: Effort normal and breath sounds normal. No respiratory distress. He has no wheezes.  Abdominal: Soft. Bowel sounds are normal. He exhibits no distension. There is no tenderness.  Musculoskeletal:  Cervical spine sore on palpation (baseline for him), Lumbar spine also sore to palpate. He also has tenderness on palpation over the left lower back. He has normal gait. Has full ROM. Has no knee crepitus or shoulder crepitus.   Vitals reviewed.   Urgent Care Course   Clinical Course     Procedures (including critical care time)  Labs Review Labs Reviewed - No data to display  Imaging Review Dg Lumbar Spine Complete  Result Date: 05/19/2016 CLINICAL DATA:  40 year old male with a history of left midline lumbar back pain EXAM: LUMBAR SPINE - COMPLETE 4+ VIEW COMPARISON:  None. FINDINGS: Lumbar Spine: Lumbar vertebral elements maintain normal alignment without evidence of anterolisthesis, retrolisthesis, subluxation. No fracture line identified. Vertebral body heights  maintained as well as disc space heights. No significant degenerative disc disease or endplate changes. No significant facet changes. Unremarkable appearance of the visualized abdomen. Oblique images demonstrate no displaced pars defect IMPRESSION: No radiographic evidence of acute fracture or malalignment of the lumbar spine. Signed, Dulcy Fanny. Earleen Newport, DO [facility] Electronically Signed   By: Corrie Mckusick D.O.   On: 05/19/2016 15:41     MDM   1. Muscle strain   2. Chronic midline low back pain without sciatica    1) Xray lumbar negative with no acute fracture or malalignment.  2) Patient has osteoarthritis of multiple joints and most likely to have arthritis of his  low back as well. He reports to have this low back pain for several years. His acute pain today could be a muscle strain or it could be an acute exacerbation of his chronic back pain.  3) RX given for Tramadol and Flexeril; [place] Controlled substances database reviewed and shows that he have not received any controlled substances in the past 6 months.  4) May return to work Friday however with restriction; no heavy lifting, pushing, or pulling 5) Patient to f/u with occupation health next week for re-evaluation 6) Encouraged to establish care with a PCP for further evaluation of the chronic pain.      Barry Dienes, NP 05/19/16 1624

## 2016-06-01 ENCOUNTER — Encounter: Payer: Self-pay | Admitting: Emergency Medicine

## 2016-06-01 ENCOUNTER — Emergency Department
Admission: EM | Admit: 2016-06-01 | Discharge: 2016-06-02 | Disposition: A | Discharge status: Home / Self Care | Payer: Worker's Compensation | Attending: Emergency Medicine | Admitting: Emergency Medicine

## 2016-06-01 DIAGNOSIS — M5416 Radiculopathy, lumbar region: Secondary | ICD-10-CM | POA: Diagnosis not present

## 2016-06-01 DIAGNOSIS — Z79899 Other long term (current) drug therapy: Secondary | ICD-10-CM | POA: Diagnosis not present

## 2016-06-01 DIAGNOSIS — M545 Low back pain, unspecified: Secondary | ICD-10-CM

## 2016-06-01 DIAGNOSIS — Z87891 Personal history of nicotine dependence: Secondary | ICD-10-CM | POA: Insufficient documentation

## 2016-06-01 MED ORDER — HYDROMORPHONE HCL 1 MG/ML IJ SOLN
1.0000 mg | Freq: Once | INTRAMUSCULAR | Status: AC
  Administered 2016-06-01: 1 mg via INTRAMUSCULAR
  Filled 2016-06-01: qty 1

## 2016-06-02 MED ORDER — DIAZEPAM 2 MG PO TABS: 2.0000 mg | ORAL_TABLET | Freq: Two times a day (BID) | ORAL | 0 refills | Status: DC | PRN

## 2016-06-02 MED ORDER — OXYCODONE-ACETAMINOPHEN 10-325 MG PO TABS: 1.0000 | ORAL_TABLET | Freq: Three times a day (TID) | ORAL | 0 refills | Status: AC | PRN

## 2016-06-02 NOTE — Discharge Instructions (Signed)
You have symptoms consistent with a lumbar radiculopathy. There is some irritation of the nerve that serves the left anterior thigh. You should take the prescription pain medicine as directed. Apply ice or moist heat to reduce symptoms.

## 2016-06-02 NOTE — ED Notes (Signed)

## 2016-06-04 DIAGNOSIS — M5416 Radiculopathy, lumbar region: Secondary | ICD-10-CM | POA: Insufficient documentation
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
## 2017-05-10 DIAGNOSIS — R110 Nausea: Secondary | ICD-10-CM | POA: Insufficient documentation

## 2017-05-10 DIAGNOSIS — R002 Palpitations: Secondary | ICD-10-CM | POA: Insufficient documentation

## 2017-05-10 DIAGNOSIS — F419 Anxiety disorder, unspecified: Secondary | ICD-10-CM | POA: Insufficient documentation

## 2017-05-10 DIAGNOSIS — R0602 Shortness of breath: Secondary | ICD-10-CM | POA: Insufficient documentation

## 2017-11-28 ENCOUNTER — Encounter: Payer: Self-pay | Admitting: Emergency Medicine

## 2017-11-28 ENCOUNTER — Emergency Department: Payer: BLUE CROSS/BLUE SHIELD

## 2017-11-28 ENCOUNTER — Other Ambulatory Visit: Payer: Self-pay

## 2017-11-28 ENCOUNTER — Emergency Department
Admission: EM | Admit: 2017-11-28 | Discharge: 2017-11-28 | Disposition: A | Discharge status: Home / Self Care | Payer: BLUE CROSS/BLUE SHIELD | Attending: Emergency Medicine | Admitting: Emergency Medicine

## 2017-11-28 DIAGNOSIS — Z87891 Personal history of nicotine dependence: Secondary | ICD-10-CM | POA: Diagnosis not present

## 2017-11-28 DIAGNOSIS — R0789 Other chest pain: Secondary | ICD-10-CM | POA: Insufficient documentation

## 2017-11-28 DIAGNOSIS — J069 Acute upper respiratory infection, unspecified: Secondary | ICD-10-CM

## 2017-11-28 DIAGNOSIS — Z79899 Other long term (current) drug therapy: Secondary | ICD-10-CM | POA: Insufficient documentation

## 2017-11-28 DIAGNOSIS — R079 Chest pain, unspecified: Secondary | ICD-10-CM

## 2017-11-28 LAB — COMPREHENSIVE METABOLIC PANEL
ALBUMIN: 4.6 g/dL (ref 3.5–5.0)
ALT: 10 U/L — ABNORMAL LOW (ref 17–63)
AST: 25 U/L (ref 15–41)
Alkaline Phosphatase: 81 U/L (ref 38–126)
Anion gap: 11 (ref 5–15)
BILIRUBIN TOTAL: 0.6 mg/dL (ref 0.3–1.2)
BUN: 15 mg/dL (ref 6–20)
CALCIUM: 9.2 mg/dL (ref 8.9–10.3)
CO2: 26 mmol/L (ref 22–32)
Chloride: 101 mmol/L (ref 101–111)
Creatinine, Ser: 0.96 mg/dL (ref 0.61–1.24)
GFR calc Af Amer: >60 mL/min (ref >60)
GFR calc non Af Amer: >60 mL/min (ref >60)
GLUCOSE: 106 mg/dL — AB (ref 65–99)
Potassium: 3.8 mmol/L (ref 3.5–5.1)
Sodium: 138 mmol/L (ref 135–145)
Total Protein: 8.4 g/dL — ABNORMAL HIGH (ref 6.5–8.1)

## 2017-11-28 LAB — TROPONIN I: Troponin I: <0.03 ng/mL (ref <0.03)

## 2017-11-28 LAB — CBC
HEMATOCRIT: 47.6 % (ref 40.0–52.0)
Hemoglobin: 16.3 g/dL (ref 13.0–18.0)
MCH: 30.8 pg (ref 26.0–34.0)
MCHC: 34.2 g/dL (ref 32.0–36.0)
MCV: 90.1 fL (ref 80.0–100.0)
PLATELETS: 295 10*3/uL (ref 150–440)
RBC: 5.29 MIL/uL (ref 4.40–5.90)
RDW: 12.8 % (ref 11.5–14.5)
WBC: 6.5 10*3/uL (ref 3.8–10.6)

## 2017-11-28 NOTE — ED Notes (Signed)
First Nurse Note:  Patient to Triage 2 for EKG.

## 2017-11-28 NOTE — ED Provider Notes (Signed)
[facility] Emergency Department Provider Note  ___________________________________________   First MD Initiated Contact with Patient 11/28/17 1247     (approximate)  I have reviewed the triage vital signs and the nursing notes.   HISTORY  Chief Complaint Chest Pain   HPI Shane [name] is a 42 y.o. male who is presenting to the emergency department today with right-sided chest pain.  He says that the chest pain started about a week and a half ago and was initially on the left side of his chest and is now migrated to the right side.  However, he denies any radiation to the neck or the bilateral arms.  Denies any radiation of the back.  Says the pain is a 1 out of 10 right now after taking 4 aspirin this morning at urgent care.  He says the pain worsens when he lays back.  Also worsening with deep breathing.  Says that the pain feels like a soreness.  Patient also states that he has had a runny nose as well as a cough that started several days after the chest pain.  Denies any ear pressure at this time.  Denies any cardiac history in his family.  Denies smoking, drinking or drugs.  Denies any history of blood clot.  Says that he has been taking Tylenol as well as ibuprofen at home for pain relief.  Says that the pain does also increase at work when he is walking around.  However, he says that he was able to do push-ups without worsening of the pain.  Does not report any diaphoresis.   Past Medical History:  Diagnosis Date  . Allergic rhinitis   . Arthritis   . BPH (benign prostatic hyperplasia)    started on saw palmetto ([facility] Dr. Yves Dill)  . Cervicalgia   . Environmental allergies   . Irritable bowel syndrome   . Pain in joint, lower leg   . Patellar tendinitis   . Polyp of nasal cavity   . Restless leg     Patient Active Problem List   Diagnosis Date Noted  . BPH (benign prostatic hypertrophy) 08/24/2011  . Blood in stool 08/19/2011  . ACUTE SINUSITIS,  UNSPECIFIED 06/18/2009  . NASAL POLYP 06/18/2009  . CARBUNCLE AND FURUNCLE OF FACE 05/05/2009  . MUSCLE SPASM, TRAPEZIUS MUSCLE, RIGHT 01/15/2009  . WART, RIGHT HAND 12/06/2008  . 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 TENDINITIS, PATELLAR 12/06/2008  . SHOULDER PAIN, RIGHT 07/03/2008  . CERVICALGIA 07/03/2008  . ALLERGIC RHINITIS 01/12/2008  . IRRITABLE BOWEL SYNDROME 01/12/2008  . JAW PAIN 04/07/2007    Past Surgical History:  Procedure Laterality Date  . IRRIGATION AND DEBRIDEMENT SEBACEOUS CYST    . TONSILLECTOMY    . VASECTOMY  2009    Prior to Admission medications   Medication Sig Start Date End Date Taking? Authorizing Provider  baclofen (LIORESAL) 10 MG tablet Take 1 tablet (10 mg total) by mouth 3 (three) times daily. 06/10/15   Fisher, Linden Dolin, PA-C  cetirizine (ZYRTEC) 10 MG tablet Take 10 mg by mouth daily.    [provider]  diazepam (VALIUM) 2 MG tablet Take 1 tablet (2 mg total) by mouth every 12 (twelve) hours as needed for muscle spasms. 06/02/16   Menshew, Dannielle Karvonen, PA-C  gabapentin (NEURONTIN) 400 MG capsule Take 400 mg by mouth daily.    [provider]  glucosamine-chondroitin 500-400 MG tablet Take 1 tablet by mouth daily.    [provider]  hydrocortisone (  ANUSOL-HC) 2.5 % rectal cream Apply to affected area bid for 1 week. Patient not taking: Reported on 05/19/2016 08/19/11   Ria Bush, MD  loratadine (CLARITIN) 10 MG tablet Take 10 mg by mouth daily.    [provider]  Multiple Vitamin (MULTIVITAMIN) tablet Take 1 tablet by mouth daily.    [provider]  naproxen sodium (ANAPROX) 220 MG tablet Take 220 mg by mouth 2 (two) times daily with a meal.    [provider]  ranitidine (ZANTAC) 150 MG tablet Take 1 tablet (150 mg total) by mouth 2 (two) times daily. Patient not taking: Reported on 06/10/2015 01/13/15   Olen Cordial, NP  SAW PALMETTO, SERENOA REPENS, PO Take by mouth.     [provider]  traMADol (ULTRAM) 50 MG tablet Take 1 tablet (50 mg total) by mouth every 6 (six) hours as needed. 05/19/16   Barry Dienes, NP    Allergies Penicillin g and Penicillins  Family History  Problem Relation Age of Onset  . Hyperlipidemia Father   . Cardiomyopathy Father        hypertrophic  . Irritable bowel syndrome Father   . Goiter Mother   . Mitral valve prolapse Sister   . Thyroid disease Unknown        maternal side  . Coronary artery disease Unknown        paternal side (coal miners)  . Crohn's disease Unknown        uncle and cousin  . Crohn's disease Paternal Uncle   . Cancer Neg Hx     Social History Social History   Tobacco Use  . Smoking status: Former Smoker    Last attempt to quit: 07/03/2003    Years since quitting: 14.4  . Smokeless tobacco: Never Used  Substance Use Topics  . Alcohol use: Yes    Comment: Occasional  . Drug use: No    Review of Systems  Constitutional: No fever/chills Eyes: No visual changes. ENT: No sore throat. Cardiovascular: As above Respiratory: As above Gastrointestinal: No abdominal pain.  No nausea, no vomiting.  No diarrhea.  No constipation. Genitourinary: Negative for dysuria. Musculoskeletal: Negative for back pain. Skin: Negative for rash. Neurological: Negative for headaches, focal weakness or numbness.   ____________________________________________   PHYSICAL EXAM:  VITAL SIGNS: ED Triage Vitals  Enc Vitals Group     BP 11/28/17 1024 135/83     Pulse Rate 11/28/17 1024 (!) 54     Resp 11/28/17 1024 20     Temp 11/28/17 1024 98 F (36.7 C)     Temp Source 11/28/17 1024 Oral     SpO2 11/28/17 1024 100 %     Weight 11/28/17 1024 165 lb (74.8 kg)     Height 11/28/17 1024 5' 10"  (1.778 m)     Head Circumference --      Peak Flow --      Pain Score 11/28/17 1027 2     Pain Loc --      Pain Edu? --      Excl. in [place]? --     Constitutional: Alert and oriented. Well appearing and in  no acute distress. Eyes: Conjunctivae are normal.  Head: Atraumatic. Nose: No congestion/rhinnorhea. Mouth/Throat: Mucous membranes are moist.  Neck: No stridor.   Cardiovascular: Normal rate, regular rhythm. Grossly normal heart sounds.  Good peripheral circulation with equal and bilateral radial pulses.  Brisk capillary refill.  Chest pain is reproducible to palpation over the right  pectoralis major muscle.  Respiratory: Normal respiratory effort.  No retractions. Lungs CTAB. Gastrointestinal: Soft and nontender. No distention. No CVA tenderness. Musculoskeletal: No lower extremity tenderness nor edema.  No joint effusions. Neurologic:  Normal speech and language. No gross focal neurologic deficits are appreciated. Skin:  Skin is warm, dry and intact. No rash noted. Psychiatric: Mood and affect are normal. Speech and behavior are normal.  ____________________________________________   LABS (all labs ordered are listed, but only abnormal results are displayed)  Labs Reviewed  COMPREHENSIVE METABOLIC PANEL - Abnormal; Notable for the following components:      Result Value   Glucose, Bld 106 (*)    Total Protein 8.4 (*)    ALT 10 (*)    All other components within normal limits  CBC  TROPONIN I   ____________________________________________  EKG  ED ECG REPORT I, Doran Stabler, the attending physician, personally viewed and interpreted this ECG.   Date: 11/28/2017  EKG Time: 1019  Rate: 57  Rhythm: normal sinus rhythm  Axis: Normal  Intervals:none  ST&T Change: No ST segment elevation or depression.  No abnormal T wave inversion.  ____________________________________________  RADIOLOGY  Normal chest x-ray ____________________________________________   PROCEDURES  Procedure(s) performed:   Procedures  Critical Care performed:   ____________________________________________   INITIAL IMPRESSION / ASSESSMENT AND PLAN / ED COURSE  Pertinent labs &  imaging results that were available during my care of the patient were reviewed by me and considered in my medical decision making (see chart for details).  Differential diagnosis includes, but is not limited to, ACS, aortic dissection, pulmonary embolism, cardiac tamponade, pneumothorax, pneumonia, pericarditis, myocarditis, GI-related causes including esophagitis/gastritis, and musculoskeletal chest wall pain.   As part of my medical decision making, I reviewed the following data within the electronic MEDICAL RECORD NUMBER Notes from prior ED visits  PE RC negative.  Heart score of 0-1.  I believe the patient is appropriate for outpatient treatment.  Likely pleurisy versus costochondritis.  Likely secondary to viral upper respiratory infection.  Patient to be given follow-up with cardiology.  He is understanding of the diagnosis as well as the treatment plan willing to comply.  ____________________________________________   FINAL CLINICAL IMPRESSION(S) / ED DIAGNOSES  Chest pain.  URI.    NEW MEDICATIONS STARTED DURING THIS VISIT:  New Prescriptions   No medications on file     Note:  This document was prepared using Dragon voice recognition software and may include unintentional dictation errors.     Orbie Pyo, MD 11/28/17 1309

## 2017-11-28 NOTE — ED Triage Notes (Signed)
Chest pain x 1 1/2 weeks. Increased with inspiration. Denies fevers. States has been intermittent. States has had some congestion.

## 2017-11-28 NOTE — ED Notes (Signed)
Esign not working at this time. Pt verbalized discharge instructions and has no questions a this time.

## 2017-11-30 ENCOUNTER — Telehealth: Payer: Self-pay

## 2017-11-30 NOTE — Telephone Encounter (Signed)
Patient scheduled for 02/02/18 at 2 pm   Nothing else needed.

## 2017-11-30 NOTE — Telephone Encounter (Signed)
Lmov for patient to call and schedule ED fu  They were seen on 11/28/17  Will try again at a later time

## 2017-12-17 DIAGNOSIS — K219 Gastro-esophageal reflux disease without esophagitis: Secondary | ICD-10-CM | POA: Insufficient documentation

## 2017-12-28 ENCOUNTER — Other Ambulatory Visit: Payer: Self-pay

## 2017-12-28 ENCOUNTER — Ambulatory Visit: Payer: BLUE CROSS/BLUE SHIELD | Admitting: Psychiatry

## 2017-12-28 ENCOUNTER — Encounter: Payer: Self-pay | Admitting: Psychiatry

## 2017-12-28 VITALS — BP 119/72 | HR 69 | Temp 98.0°F | Wt 161.4 lb

## 2017-12-28 DIAGNOSIS — F410 Panic disorder [episodic paroxysmal anxiety] without agoraphobia: Secondary | ICD-10-CM | POA: Diagnosis not present

## 2017-12-28 DIAGNOSIS — F4310 Post-traumatic stress disorder, unspecified: Secondary | ICD-10-CM

## 2017-12-28 DIAGNOSIS — F411 Generalized anxiety disorder: Secondary | ICD-10-CM

## 2017-12-28 MED ORDER — QUETIAPINE FUMARATE 50 MG PO TABS: 25.0000 mg | ORAL_TABLET | Freq: Every day | ORAL | 1 refills | Status: DC

## 2017-12-28 NOTE — Progress Notes (Signed)
Psychiatric Initial Adult Assessment   Patient Identification: Shane [name] MRN:  [id number] Date of Evaluation:  12/28/2017 Referral Source: Dr.Jasmine Candiss Norse Chief Complaint:  ' I am here to establish care." Chief Complaint    Establish Care; Anxiety; Depression; Panic Attack; Fatigue; Stress     Visit Diagnosis:    ICD-10-CM   1. PTSD (post-traumatic stress disorder) F43.10   2. Panic attack F41.0   3. GAD (generalized anxiety disorder) F41.1     History of Present Illness: Shane [name] is a 42 year old Caucasian male, unemployed, lives in [place], married, has a history of anxiety symptoms, PTSD, insomnia, mood lability, presented to the clinic today to establish care.  Patient today reports he has been struggling with anxiety symptoms since the past several years.  Patient reports he started first having mental health symptoms when he were 42 years old and at that time was under the care of a psychiatrist from age [age] years old.  Patient reports at that time his parents were going through a divorce and it affected him emotionally.  Patient reports he had his first panic attack in November 2018.  Patient was going through a lot of stressors at that time including relationship problems.  Patient reports his panic attack was so severe that he felt like he was going to have a heart attack.  He describes his panic symptoms as racing heart rate, chest pain, shortness of breath and feeling of impending doom.  Patient usually reports the symptoms last for a few minutes.  Patient reports he had to EMS at that time for help.  Patient reports he was started on a medication called Zoloft which helps to some extent.  He reports he has actually noticed a difference.  He reports he is not a medication person and wants to stay on a low dose if possible.  Patient also calls himself a Research officer, trade union.  He worries about everything to the extreme.  He has a diagnosis of generalized anxiety disorder.  He reports Zoloft may  be helping to some extent.  Patient reports he was in combat in [place] in the past.  He has an honorable discharge.  He also was in [place] in the past.  He returned from [place] sometime in 2003/4.  Patient reports he witnessed a lot of traumatic events while in [place].  He reports he struggles with intrusive memories flashbacks, nightmares, mood lability, hypervigilance, negative self-image, foreshortened future and so on on a regular basis.  Patient reports his sleep is affected on a regular basis.  He reports he gets nightmares and panic attacks at night.  He also describes some kind of jerks that he get in his sleep.  He reports he was referred for a sleep study in the past but his health insurance plan did not qualify him for an elaborate in- lab  sleep study and instead he got a home sleep study which did not show anything.  Patient reports he is interested in a sleep medication.  Patient reports he has inability to hold a job because of his mental health problems.  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  He reports he was injured at work and quit it.  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  Patient stopped working there and went to [employer] improvement where he worked for 2 years.  Patient reports he decided to try something more challenging and started working as an  Building surveyor for [employer] however he was fired after a week of work there.  The explanation they gave him was that he was not a good good fit.  Reports a history of relationship conflicts with his wife.  Patient reports he and his wife are better now than before and they are communicating more.  Patient reports he is in therapy with Dr. Lynann Beaver.  Patient denies abusing any drugs or alcohol at this time.  Does have a history of alcohol abuse in the past but he has been sober since the past several years.  Associated Signs/Symptoms: Depression  Symptoms:  insomnia, difficulty concentrating, anxiety, panic attacks, (Hypo) Manic Symptoms:  Irritable Mood, Labiality of Mood, Anxiety Symptoms:  Excessive Worry, Panic Symptoms, Psychotic Symptoms:  denies PTSD Symptoms: Had a traumatic exposure:  as noted above  Past Psychiatric History: Patient was under the care of her psychiatrist from age [age] years old.  Patient currently sees Dr. Lynann Beaver for psychotherapy.  Patient is on Zoloft prescribed by his PMD for his anxiety symptoms.  Previous Psychotropic Medications: Yes  zoloft  Substance Abuse History in the last 12 months:  No.  Consequences of Substance Abuse: Negative  Past Medical History:  Past Medical History:  Diagnosis Date  . Allergic rhinitis   . Arthritis   . BPH (benign prostatic hyperplasia)    started on saw palmetto ([facility] Dr. Yves Dill)  . Cervicalgia   . Environmental allergies   . Irritable bowel syndrome   . Pain in joint, lower leg   . Patellar tendinitis   . Polyp of nasal cavity   . Restless leg     Past Surgical History:  Procedure Laterality Date  . IRRIGATION AND DEBRIDEMENT SEBACEOUS CYST    . KNEE SURGERY Bilateral   . TONSILLECTOMY    . VASECTOMY  2009    Family Psychiatric History: Father-ADHD, son-mental health problems.  Family History:  Family History  Problem Relation Age of Onset  . Hyperlipidemia Father   . Cardiomyopathy Father        hypertrophic  . Irritable bowel syndrome Father   . Alcohol abuse Father   . Goiter Mother   . Mitral valve prolapse Sister   . Thyroid disease Unknown        maternal side  . Coronary artery disease Unknown        paternal side (coal miners)  . Crohn's disease Unknown        uncle and cousin  . Crohn's disease Paternal Uncle   . Alcohol abuse Paternal Uncle   . Alcohol abuse Cousin   . Anxiety disorder Cousin   . Cancer Neg Hx     Social History:   Social History   Socioeconomic History  . Marital status: Married     Spouse name: tare  . Number of children: 2  . Years of education: Not on file  . Highest education level: Bachelor's degree (e.g., BA, AB, BS)  Occupational History    Comment: unemployed  Social Needs  . Financial resource strain: Not very hard  . Food insecurity:    Worry: Sometimes true    Inability: Sometimes true  . Transportation needs:    Medical: No    Non-medical: No  Tobacco Use  . Smoking status: Former Smoker    Last attempt to quit: 07/03/2003    Years since quitting: 14.4  . Smokeless tobacco: Never Used  Substance and Sexual Activity  . Alcohol use: Not Currently  Comment: Occasional  . Drug use: No  . Sexual activity: Yes  Lifestyle  . Physical activity:    Days per week: 0 days    Minutes per session: 0 min  . Stress: Not on file  Relationships  . Social connections:    Talks on phone: Three times a week    Gets together: Once a week    Attends religious service: More than 4 times per year    Active member of club or organization: Yes    Attends meetings of clubs or organizations: More than 4 times per year    Relationship status: Married  Other Topics Concern  . Not on file  Social History Narrative  . Not on file    Additional Social History: Pt has been married since the past 18 years.  His wife is a Theme park manager.  They live together in [place].  They have 2 children aged 22 and 44 years old.  Patient used to be in the [employer].  He was honorably discharged in 2003.  He held several jobs after that however has been unable to hold a job.  He is currently unemployed.  Allergies:   Allergies  Allergen Reactions  . Penicillin G Swelling  . Penicillins     REACTION: throat swelling    Metabolic Disorder Labs: No results found for: HGBA1C, MPG No results found for: PROLACTIN No results found for: CHOL, TRIG, HDL, CHOLHDL, VLDL, LDLCALC   Current Medications: Current Outpatient Medications  Medication Sig Dispense Refill  . Multiple Vitamin  (MULTIVITAMIN) tablet Take 1 tablet by mouth daily.    . SAW PALMETTO, SERENOA REPENS, PO Take by mouth.    . sertraline (ZOLOFT) 50 MG tablet Take by mouth.    . QUEtiapine (SEROQUEL) 50 MG tablet Take 0.5-1 tablets (25-50 mg total) by mouth at bedtime. 30 tablet 1   No current facility-administered medications for this visit.     Neurologic: Headache: No Seizure: No Paresthesias:No  Musculoskeletal: Strength & Muscle Tone: within normal limits Gait & Station: normal Patient leans: N/A  Psychiatric Specialty Exam: Review of Systems  Psychiatric/Behavioral: Positive for depression. The patient is nervous/anxious and has insomnia.   All other systems reviewed and are negative.   Blood pressure 119/72, pulse 69, temperature 98 F (36.7 C), temperature source Oral, weight 161 lb 6.4 oz (73.2 kg).Body mass index is 23.16 kg/m.  General Appearance: Casual  Eye Contact:  Fair  Speech:  Clear and Coherent  Volume:  Normal  Mood:  Anxious and Irritable  Affect:  Congruent  Thought Process:  Goal Directed and Descriptions of Associations: Intact  Orientation:  Full (Time, Place, and Person)  Thought Content:  Logical  Suicidal Thoughts:  No  Homicidal Thoughts:  No  Memory:  Immediate;   Fair Recent;   Fair Remote;   Fair  Judgement:  Fair  Insight:  Fair  Psychomotor Activity:  Normal  Concentration:  Concentration: Fair and Attention Span: Fair  Recall:  [employer] of Knowledge:Fair  Language: Fair  Akathisia:  No  Handed:  Right  AIMS (if indicated):  na  Assets:  Communication Skills Desire for Improvement Social Support  ADL's:  Intact  Cognition: WNL  Sleep:  poor    Treatment Plan Summary:Takeshi is a 42 year old Caucasian male, unemployed, ex-veteran, married, lives in [place], has a history of anxiety attacks, insomnia, PTSD, gastroesophageal reflux disease, presented to the clinic today to establish care.  Patient is biologically predisposed given his  history  of trauma, being in the [employer], history of mental health problems in his family.  Patient also has current psychosocial stressors of financial problems, relationship struggles, health issues ,being unemployed, inability to hold a job.  Patient denies suicidality or homicidality.  Patient has good social support.  Patient is currently in psychotherapy with Dr. Grandville Silos.  Patient reports his current medication as beneficial.  He continues to struggle with sleep problems.  Will make medication readjustment. Medication management and Plan as noted below Plan PTSD Continue psychotherapy with Dr. Grandville Silos. Continue Zoloft 50 mg p.o. daily.  Patient reports he wants to stay on that dosage at this time. Start Seroquel 25 mg p.o. nightly.  Discussed with patient to go up to 50 mg if he tolerates the 25 mg well after a week or 2.  Panic Attacks Patient reports Zoloft as effective and wants to stay on that dose.  He will continue psychotherapy.  For insomnia Likely due to panic symptoms as well as PTSD. Seroquel will help with his sleep problems.  We will continue to monitor closely.  Patient may benefit from an in lab sleep study, can be referred if he continues to have sleep issues.  Patient reports he is currently being worked up for gastroesophageal reflux problems.  He also has chest pain and has been investigated for the same.  He has been referred to a GI specialist.  Follow-up in clinic in 1 month.  More than 50 % of the time was spent for psychoeducation and supportive psychotherapy and care coordination.  This note was generated in part or whole with voice recognition software. Voice recognition is usually quite accurate but there are transcription errors that can and very often do occur. I apologize for any typographical errors that were not detected and corrected.      Ursula Alert, MD 7/3/20195:27 PM

## 2017-12-28 NOTE — Patient Instructions (Signed)
Quetiapine tablets What is this medicine? QUETIAPINE (kwe TYE a peen) is an antipsychotic. It is used to treat schizophrenia and bipolar disorder, also known as manic-depression. This medicine may be used for other purposes; ask your health care provider or pharmacist if you have questions. COMMON BRAND NAME(S): Seroquel What should I tell my health care provider before I take this medicine? They need to know if you have any of these conditions: -brain tumor or head injury -breast cancer -cataracts -diabetes -difficulty swallowing -heart disease -kidney disease -liver disease -low blood counts, like low white cell, platelet, or red cell counts -low blood pressure or dizziness when standing up -Parkinson's disease -previous heart attack -seizures -suicidal thoughts, plans, or attempt by you or a family member -thyroid disease -an unusual or allergic reaction to quetiapine, other medicines, foods, dyes, or preservatives -pregnant or trying to get pregnant -breast-feeding How should I use this medicine? Take this medicine by mouth. Swallow it with a drink of water. Follow the directions on the prescription label. If it upsets your stomach you can take it with food. Take your medicine at regular intervals. Do not take it more often than directed. Do not stop taking except on the advice of your doctor or health care professional. A special MedGuide will be given to you by the pharmacist with each prescription and refill. Be sure to read this information carefully each time. Talk to your pediatrician regarding the use of this medicine in children. While this drug may be prescribed for children as young as 10 years for selected conditions, precautions do apply. Patients over age 42 years may have a stronger reaction to this medicine and need smaller doses. Overdosage: If you think you have taken too much of this medicine contact a poison control center or emergency room at once. NOTE: This  medicine is only for you. Do not share this medicine with others. What if I miss a dose? If you miss a dose, take it as soon as you can. If it is almost time for your next dose, take only that dose. Do not take double or extra doses. What may interact with this medicine? Do not take this medicine with any of the following medications: -certain medicines for fungal infections like fluconazole, itraconazole, ketoconazole, posaconazole, voriconazole -cisapride -dofetilide -dronedarone -droperidol -grepafloxacin -halofantrine -phenothiazines like chlorpromazine, mesoridazine, thioridazine -pimozide -sparfloxacin -ziprasidone This medicine may also interact with the following medications: -alcohol -antiviral medicines for HIV or AIDS -certain medicines for blood pressure -certain medicines for depression, anxiety, or psychotic disturbances like haloperidol, lorazepam -certain medicines for diabetes -certain medicines for Parkinson's disease -certain medicines for seizures like carbamazepine, phenobarbital, phenytoin -cimetidine -erythromycin -other medicines that prolong the QT interval (cause an abnormal heart rhythm) -rifampin -steroid medicines like prednisone or cortisone This list may not describe all possible interactions. Give your health care provider a list of all the medicines, herbs, non-prescription drugs, or dietary supplements you use. Also tell them if you smoke, drink alcohol, or use illegal drugs. Some items may interact with your medicine. What should I watch for while using this medicine? Visit your doctor or health care professional for regular checks on your progress. It may be several weeks before you see the full effects of this medicine. Your health care provider may suggest that you have your eyes examined prior to starting this medicine, and every 6 months thereafter. If you have been taking this medicine regularly for some time, do not suddenly stop taking it.  You must gradually  reduce the dose or your symptoms may get worse. Ask your doctor or health care professional for advice. Patients and their families should watch out for worsening depression or thoughts of suicide. Also watch out for sudden or severe changes in feelings such as feeling anxious, agitated, panicky, irritable, hostile, aggressive, impulsive, severely restless, overly excited and hyperactive, or not being able to sleep. If this happens, especially at the beginning of antidepressant treatment or after a change in dose, call your health care professional. Dennis Bast may get dizzy or drowsy. Do not drive, use machinery, or do anything that needs mental alertness until you know how this medicine affects you. Do not stand or sit up quickly, especially if you are an older patient. This reduces the risk of dizzy or fainting spells. Alcohol can increase dizziness and drowsiness. Avoid alcoholic drinks. Do not treat yourself for colds, diarrhea or allergies. Ask your doctor or health care professional for advice, some ingredients may increase possible side effects. This medicine can reduce the response of your body to heat or cold. Dress warm in cold weather and stay hydrated in hot weather. If possible, avoid extreme temperatures like saunas, hot tubs, very hot or cold showers, or activities that can cause dehydration such as vigorous exercise. What side effects may I notice from receiving this medicine? Side effects that you should report to your doctor or health care professional as soon as possible: -allergic reactions like skin rash, itching or hives, swelling of the face, lips, or tongue -difficulty swallowing -fast or irregular heartbeat -fever or chills, sore throat -fever with rash, swollen lymph nodes, or swelling of the face -increased hunger or thirst -increased urination -problems with balance, talking, walking -seizures -stiff muscles -suicidal thoughts or other mood  changes -uncontrollable head, mouth, neck, arm, or leg movements -unusually weak or tired Side effects that usually do not require medical attention (report to your doctor or health care professional if they continue or are bothersome): -change in sex drive or performance -constipation -drowsy or dizzy -dry mouth -stomach upset -weight gain This list may not describe all possible side effects. Call your doctor for medical advice about side effects. You may report side effects to FDA at 1-800-FDA-1088. Where should I keep my medicine? Keep out of the reach of children. Store at room temperature between 15 and 30 degrees C (59 and 86 degrees F). Throw away any unused medicine after the expiration date. NOTE: This sheet is a summary. It may not cover all possible information. If you have questions about this medicine, talk to your doctor, pharmacist, or health care provider.  2018 Elsevier/Gold Standard (2014-12-17 13:07:35)

## 2018-01-27 ENCOUNTER — Other Ambulatory Visit: Payer: Self-pay | Admitting: Psychiatry

## 2018-01-31 ENCOUNTER — Encounter: Payer: Self-pay | Admitting: Psychiatry

## 2018-01-31 ENCOUNTER — Ambulatory Visit: Payer: BLUE CROSS/BLUE SHIELD | Admitting: Psychiatry

## 2018-01-31 ENCOUNTER — Other Ambulatory Visit: Payer: Self-pay

## 2018-01-31 VITALS — BP 123/71 | HR 67 | Temp 98.5°F | Wt 165.4 lb

## 2018-01-31 DIAGNOSIS — F4310 Post-traumatic stress disorder, unspecified: Secondary | ICD-10-CM | POA: Diagnosis not present

## 2018-01-31 DIAGNOSIS — F411 Generalized anxiety disorder: Secondary | ICD-10-CM

## 2018-01-31 DIAGNOSIS — F410 Panic disorder [episodic paroxysmal anxiety] without agoraphobia: Secondary | ICD-10-CM

## 2018-01-31 MED ORDER — SERTRALINE HCL 50 MG PO TABS: 50.0000 mg | ORAL_TABLET | Freq: Every day | ORAL | 2 refills | Status: DC

## 2018-01-31 NOTE — Progress Notes (Signed)
[facility] MD  OP Progress Note  01/31/2018 12:57 PM Shane [name]  MRN:  [id number]  Chief Complaint: ' I am here for follow up." Chief Complaint    Follow-up; Medication Refill     HPI: [name] is 42 year old Caucasian male, unemployed, lives in [place], married, has a history of anxiety symptoms, PTSD, insomnia, mood lability, presented to the clinic today for a follow-up visit.  Patient today reports he has been overall compliant with his Zoloft and Seroquel since the last one month.  He reports that Zoloft as effective for his mood symptoms.  He has not had a panic attack in a long time.  He however continues to have some feeling of his throat closing in on him and having some difficulty breathing usually when he goes to bed at night.  He reports he has an endoscopy scheduled to rule out GI issues which is coming up soon.  He reports sleep as improved overall.  He continues to take the Seroquel 50 mg.  He reports there are nights when he sleeps within 15 minutes of taking the medication and there are some nights when he needs 2 hours or so to fall asleep.  He has not been sticking to a good bedtime routine and hence discussed with him to follow a good sleep hygiene.  He not want to make any medication dosage changes today with his Seroquel.  He continues to be unemployed however has been attending job interviews.  He also does ministry work and his wife continues to work as a Theme park manager.  He reports he stays home most of the time taking care of his children who are 30 and 63 years old now that they are out of school.  He reports he enjoys them.       Visit Diagnosis:    ICD-10-CM   1. PTSD (post-traumatic stress disorder) F43.10 sertraline (ZOLOFT) 50 MG tablet  2. Panic attack F41.0   3. GAD (generalized anxiety disorder) F41.1 sertraline (ZOLOFT) 50 MG tablet    Past Psychiatric History: I have reviewed past psychiatric history from my progress note on 12/28/2017.  Past trials of Zoloft.  Past  Medical History:  Past Medical History:  Diagnosis Date  . Allergic rhinitis   . Arthritis   . BPH (benign prostatic hyperplasia)    started on saw palmetto ([facility] Dr. Yves Dill)  . Cervicalgia   . Environmental allergies   . Irritable bowel syndrome   . Pain in joint, lower leg   . Patellar tendinitis   . Polyp of nasal cavity   . Restless leg     Past Surgical History:  Procedure Laterality Date  . IRRIGATION AND DEBRIDEMENT SEBACEOUS CYST    . KNEE SURGERY Bilateral   . TONSILLECTOMY    . VASECTOMY  2009    Family Psychiatric History: Have reviewed family psychiatric history from my progress note on 12/28/2017.  Family History:  Family History  Problem Relation Age of Onset  . Hyperlipidemia Father   . Cardiomyopathy Father        hypertrophic  . Irritable bowel syndrome Father   . Alcohol abuse Father   . Goiter Mother   . Mitral valve prolapse Sister   . Thyroid disease Unknown        maternal side  . Coronary artery disease Unknown        paternal side (coal miners)  . Crohn's disease Unknown        uncle and cousin  . Crohn's disease  Paternal Uncle   . Alcohol abuse Paternal Uncle   . Alcohol abuse Cousin   . Anxiety disorder Cousin   . Cancer Neg Hx    Substance abuse history: Denies  Social History: Reviewed social history from my progress note on 12/28/2017. Social History   Socioeconomic History  . Marital status: Married    Spouse name: tare  . Number of children: 2  . Years of education: Not on file  . Highest education level: Bachelor's degree (e.g., BA, AB, BS)  Occupational History    Comment: unemployed  Social Needs  . Financial resource strain: Not very hard  . Food insecurity:    Worry: Sometimes true    Inability: Sometimes true  . Transportation needs:    Medical: No    Non-medical: No  Tobacco Use  . Smoking status: Former Smoker    Last attempt to quit: 07/03/2003    Years since quitting: 14.5  . Smokeless tobacco: Never Used   Substance and Sexual Activity  . Alcohol use: Not Currently    Comment: Occasional  . Drug use: No  . Sexual activity: Yes  Lifestyle  . Physical activity:    Days per week: 0 days    Minutes per session: 0 min  . Stress: Not on file  Relationships  . Social connections:    Talks on phone: Three times a week    Gets together: Once a week    Attends religious service: More than 4 times per year    Active member of club or organization: Yes    Attends meetings of clubs or organizations: More than 4 times per year    Relationship status: Married  Other Topics Concern  . Not on file  Social History Narrative  . Not on file    Allergies:  Allergies  Allergen Reactions  . Penicillin G Swelling  . Penicillins     REACTION: throat swelling    Metabolic Disorder Labs: No results found for: HGBA1C, MPG No results found for: PROLACTIN No results found for: CHOL, TRIG, HDL, CHOLHDL, VLDL, LDLCALC No results found for: TSH  Therapeutic Level Labs: No results found for: LITHIUM No results found for: VALPROATE No components found for:  CBMZ  Current Medications: Current Outpatient Medications  Medication Sig Dispense Refill  . Multiple Vitamin (MULTIVITAMIN) tablet Take 1 tablet by mouth daily.    . pantoprazole (PROTONIX) 40 MG tablet   2  . QUEtiapine (SEROQUEL) 50 MG tablet TAKE 1/2 TO 1 TABLET BY MOUTH AT BEDTIME 30 tablet 1  . Saw Palmetto, Serenoa repens, (SAW PALMETTO PO) Take by mouth.    . sertraline (ZOLOFT) 50 MG tablet Take 1 tablet (50 mg total) by mouth daily. 30 tablet 2   No current facility-administered medications for this visit.      Musculoskeletal: Strength & Muscle Tone: within normal limits Gait & Station: normal Patient leans: N/A  Psychiatric Specialty Exam: Review of Systems  Psychiatric/Behavioral: The patient is nervous/anxious.   All other systems reviewed and are negative.   Blood pressure 123/71, pulse 67, temperature 98.5 F (36.9  C), temperature source Oral, weight 165 lb 6.4 oz (75 kg).Body mass index is 23.73 kg/m.  General Appearance: Casual  Eye Contact:  Fair  Speech:  Clear and Coherent  Volume:  Normal  Mood:  Anxious  Affect:  Congruent  Thought Process:  Goal Directed and Descriptions of Associations: Intact  Orientation:  Full (Time, Place, and Person)  Thought Content: Logical  Suicidal Thoughts:  No  Homicidal Thoughts:  No  Memory:  Immediate;   Fair Recent;   Fair Remote;   Fair  Judgement:  Fair  Insight:  Fair  Psychomotor Activity:  Normal  Concentration:  Concentration: Fair and Attention Span: Fair  Recall:  [employer] of Knowledge: Fair  Language: Fair  Akathisia:  No  Handed:  Right  AIMS (if indicated): Denies tremors , rigidity, stiffness  Assets:  Communication Skills Desire for Improvement Social Support  ADL's:  Intact  Cognition: WNL  Sleep:  improving   Screenings:   Assessment and Plan: Michial is a 42 year old Caucasian male, unemployed, ex-veteran, married, lives in [place], has a history of anxiety attacks, insomnia, PTSD, GERD, presented to the clinic today for a follow-up visit.  Patient is biologically predisposed given his history of trauma, being in the [employer], history of mental health problems in his family.  He also continues to have psychosocial stressors of financial problems, relationship struggles, health issues, being unemployed, inability to hold a job and so on.  He is compliant on his medication and is interested in psychotherapy.  Will refer him for the same.  Plan PTSD Continue Zoloft 50 mg p.o. daily Continue Seroquel 50 mg p.o. nightly Patient reports he wants to find a new therapist ,he was seeing Dr. Lynann Beaver in the past.  We will give him a list of psychotherapist in the neighborhood.  Panic attacks Continue Zoloft as prescribed  For insomnia Continue Seroquel as prescribed Discussed sleep hygiene.  Patient has a history of  gastroesophageal reflux problems and is currently being investigated by a GI specialist.  He is scheduled for an endoscopy soon.  Follow-up in clinic in 1 month.  More than 50 % of the time was spent for psychoeducation and supportive psychotherapy and care coordination.  This note was generated in part or whole with voice recognition software. Voice recognition is usually quite accurate but there are transcription errors that can and very often do occur. I apologize for any typographical errors that were not detected and corrected.       Ursula Alert, MD 01/31/2018, 12:57 PM

## 2018-02-02 ENCOUNTER — Ambulatory Visit: Payer: BLUE CROSS/BLUE SHIELD | Admitting: Internal Medicine

## 2018-03-03 ENCOUNTER — Ambulatory Visit (INDEPENDENT_AMBULATORY_CARE_PROVIDER_SITE_OTHER): Payer: BLUE CROSS/BLUE SHIELD | Admitting: Psychiatry

## 2018-03-03 ENCOUNTER — Encounter: Payer: Self-pay | Admitting: Psychiatry

## 2018-03-03 ENCOUNTER — Other Ambulatory Visit: Payer: Self-pay

## 2018-03-03 VITALS — BP 128/77 | HR 60 | Temp 97.7°F | Wt 166.2 lb

## 2018-03-03 DIAGNOSIS — F4310 Post-traumatic stress disorder, unspecified: Secondary | ICD-10-CM | POA: Diagnosis not present

## 2018-03-03 DIAGNOSIS — F411 Generalized anxiety disorder: Secondary | ICD-10-CM

## 2018-03-03 DIAGNOSIS — F410 Panic disorder [episodic paroxysmal anxiety] without agoraphobia: Secondary | ICD-10-CM | POA: Diagnosis not present

## 2018-03-03 MED ORDER — HYDROXYZINE PAMOATE 25 MG PO CAPS: 25.0000 mg | ORAL_CAPSULE | Freq: Every evening | ORAL | 1 refills | Status: AC | PRN

## 2018-03-03 MED ORDER — QUETIAPINE FUMARATE 50 MG PO TABS: 25.0000 mg | ORAL_TABLET | Freq: Every day | ORAL | 1 refills | Status: DC

## 2018-03-03 MED ORDER — SERTRALINE HCL 50 MG PO TABS: 50.0000 mg | ORAL_TABLET | Freq: Every day | ORAL | 2 refills | Status: DC

## 2018-03-03 NOTE — Progress Notes (Signed)
[facility] MD OP Progress Note  03/03/2018 10:56 AM Shane [name]  MRN:  [id number]  Chief Complaint: ' I am here for follow up." Chief Complaint    Follow-up; Medication Refill     HPI: Shane [name] is a 42 yr old Caucasian male, employed part time , lives in [place] , separated , has a history of anxiety symptoms, PTSD, insomnia, mood lability presented to the clinic today for a follow-up visit.  Patient reports he has a lot of psychosocial stressors at this time.  He reports he and his wife separated and are trying to make it formal.  He reports he currently stays with his mother in [place].  He reports his mother recently got diagnosed with breast cancer and that is also another stressor for him.  He has been unable to find a job yet.  He however has started working part-time in [employer] with his friend.  He reports he is happy that it at least pays him something.  He continues to have racing thoughts, anxiety symptoms as well as some sleep issues on and off.  He reports he is compliant with his Zoloft and Seroquel.  Reports he does not want to make any dosage changes with his Seroquel or Zoloft today.  He reports the Seroquel sometimes makes him groggy or tired in the morning.  He hence wants to stay on the same dose.  Discussed adding hydroxyzine as needed and he agrees with plan.  He denies any suicidality or homicidality.  He denies any perceptual disturbances.  Discussed referral for psychotherapy with our therapist here in clinic and he agrees with plan.   Visit Diagnosis:    ICD-10-CM   1. PTSD (post-traumatic stress disorder) F43.10 QUEtiapine (SEROQUEL) 50 MG tablet    sertraline (ZOLOFT) 50 MG tablet    hydrOXYzine (VISTARIL) 25 MG capsule  2. Panic attack F41.0 QUEtiapine (SEROQUEL) 50 MG tablet    hydrOXYzine (VISTARIL) 25 MG capsule  3. GAD (generalized anxiety disorder) F41.1 sertraline (ZOLOFT) 50 MG tablet    Past Psychiatric History: Have reviewed past psychiatric history  from my progress note on 12/28/2017.  Past trials of Zoloft.  Past Medical History:  Past Medical History:  Diagnosis Date  . Allergic rhinitis   . Arthritis   . BPH (benign prostatic hyperplasia)    started on saw palmetto ([facility] Dr. Yves Dill)  . Cervicalgia   . Environmental allergies   . Irritable bowel syndrome   . Pain in joint, lower leg   . Patellar tendinitis   . Polyp of nasal cavity   . Restless leg     Past Surgical History:  Procedure Laterality Date  . IRRIGATION AND DEBRIDEMENT SEBACEOUS CYST    . KNEE SURGERY Bilateral   . TONSILLECTOMY    . VASECTOMY  2009    Family Psychiatric History: Reviewed family psychiatric history from my progress note on 12/28/2017.  Family History:  Family History  Problem Relation Age of Onset  . Hyperlipidemia Father   . Cardiomyopathy Father        hypertrophic  . Irritable bowel syndrome Father   . Alcohol abuse Father   . Goiter Mother   . Mitral valve prolapse Sister   . Thyroid disease Unknown        maternal side  . Coronary artery disease Unknown        paternal side (coal miners)  . Crohn's disease Unknown        uncle and cousin  . Crohn's  disease Paternal Uncle   . Alcohol abuse Paternal Uncle   . Alcohol abuse Cousin   . Anxiety disorder Cousin   . Cancer Neg Hx     Social History: Reviewed social history from my progress note on 12/28/2017. Social History   Socioeconomic History  . Marital status: Married    Spouse name: tare  . Number of children: 2  . Years of education: Not on file  . Highest education level: Bachelor's degree (e.g., BA, AB, BS)  Occupational History    Comment: unemployed  Social Needs  . Financial resource strain: Not very hard  . Food insecurity:    Worry: Sometimes true    Inability: Sometimes true  . Transportation needs:    Medical: No    Non-medical: No  Tobacco Use  . Smoking status: Former Smoker    Last attempt to quit: 07/03/2003    Years since quitting: 14.6  .  Smokeless tobacco: Never Used  Substance and Sexual Activity  . Alcohol use: Not Currently    Comment: Occasional  . Drug use: No  . Sexual activity: Yes  Lifestyle  . Physical activity:    Days per week: 0 days    Minutes per session: 0 min  . Stress: Not on file  Relationships  . Social connections:    Talks on phone: Three times a week    Gets together: Once a week    Attends religious service: More than 4 times per year    Active member of club or organization: Yes    Attends meetings of clubs or organizations: More than 4 times per year    Relationship status: Married  Other Topics Concern  . Not on file  Social History Narrative  . Not on file    Allergies:  Allergies  Allergen Reactions  . Penicillin G Swelling  . Penicillins     REACTION: throat swelling    Metabolic Disorder Labs: No results found for: HGBA1C, MPG No results found for: PROLACTIN No results found for: CHOL, TRIG, HDL, CHOLHDL, VLDL, LDLCALC No results found for: TSH  Therapeutic Level Labs: No results found for: LITHIUM No results found for: VALPROATE No components found for:  CBMZ  Current Medications: Current Outpatient Medications  Medication Sig Dispense Refill  . hydrOXYzine (VISTARIL) 25 MG capsule Take 1-2 capsules (25-50 mg total) by mouth at bedtime as needed for anxiety (sleep). 60 capsule 1  . Multiple Vitamin (MULTIVITAMIN) tablet Take 1 tablet by mouth daily.    . pantoprazole (PROTONIX) 40 MG tablet   2  . QUEtiapine (SEROQUEL) 50 MG tablet Take 0.5-1 tablets (25-50 mg total) by mouth at bedtime. 30 tablet 1  . Saw Palmetto, Serenoa repens, (SAW PALMETTO PO) Take by mouth.    . sertraline (ZOLOFT) 50 MG tablet Take 1 tablet (50 mg total) by mouth daily. 30 tablet 2   No current facility-administered medications for this visit.      Musculoskeletal: Strength & Muscle Tone: within normal limits Gait & Station: normal Patient leans: N/A  Psychiatric Specialty  Exam: Review of Systems  Psychiatric/Behavioral: Positive for depression. The patient is nervous/anxious.   All other systems reviewed and are negative.   Blood pressure 128/77, pulse 60, temperature 97.7 F (36.5 C), temperature source Oral, weight 166 lb 3.2 oz (75.4 kg).Body mass index is 23.85 kg/m.  General Appearance: Casual  Eye Contact:  Fair  Speech:  Clear and Coherent  Volume:  Normal  Mood:  Anxious  Affect:  Appropriate  Thought Process:  Goal Directed and Descriptions of Associations: Intact  Orientation:  Full (Time, Place, and Person)  Thought Content: Logical   Suicidal Thoughts:  No  Homicidal Thoughts:  No  Memory:  Immediate;   Fair Recent;   Fair Remote;   Fair  Judgement:  Fair  Insight:  Fair  Psychomotor Activity:  Normal  Concentration:  Concentration: Fair and Attention Span: Fair  Recall:  [employer] of Knowledge: Fair  Language: Fair  Akathisia:  No  Handed:  Right  AIMS (if indicated): denies tremors, rigidity,stiffness  Assets:  Communication Skills Desire for Improvement Social Support  ADL's:  Intact  Cognition: WNL  Sleep:  restless   Screenings:   Assessment and Plan: Brevon is a 42 year old Caucasian male, employed part-time, Scientist, clinical (histocompatibility and immunogenetics), separated, lives in [place], has a history of anxiety attacks, insomnia, PTSD, GERD, presented to the clinic today for a follow-up visit.  Patient is biologically predisposed given his history of trauma being in [employer], history of mental health problems and family.  He also has psychosocial stressors of financial issues, recent separation with wife,, his mother being diagnosed with cancer and so on.  He is compliant on his medications.  He has been unable to find a therapist and hence will refer him to a therapist here in clinic.  Plan PTSD Continue Zoloft 50 mg p.o. daily Continue Seroquel 50 mg p.o. nightly.  He reports he does not want to make any more dosage changes with the Seroquel. Add  hydroxyzine 25-50 mg p.o. nightly as needed  For panic attack Continue Zoloft as prescribed Add hydroxyzine 25-50 mg p.o. as needed  For insomnia Seroquel as prescribed  Patient will start psychotherapy with our therapist here in clinic.  Follow-up in clinic in 2-4 weeks or sooner if needed.  More than 50 % of the time was spent for psychoeducation and supportive psychotherapy and care coordination.  This note was generated in part or whole with voice recognition software. Voice recognition is usually quite accurate but there are transcription errors that can and very often do occur. I apologize for any typographical errors that were not detected and corrected.       Ursula Alert, MD 03/03/2018, 10:56 AM

## 2018-03-06 ENCOUNTER — Ambulatory Visit (INDEPENDENT_AMBULATORY_CARE_PROVIDER_SITE_OTHER): Payer: BLUE CROSS/BLUE SHIELD | Admitting: Licensed Clinical Social Worker

## 2018-03-06 DIAGNOSIS — F4310 Post-traumatic stress disorder, unspecified: Secondary | ICD-10-CM
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
## 2018-03-16 ENCOUNTER — Encounter: Payer: Self-pay | Admitting: Licensed Clinical Social Worker

## 2018-03-16 ENCOUNTER — Ambulatory Visit (INDEPENDENT_AMBULATORY_CARE_PROVIDER_SITE_OTHER): Payer: BLUE CROSS/BLUE SHIELD | Admitting: Licensed Clinical Social Worker

## 2018-03-16 DIAGNOSIS — F4310 Post-traumatic stress disorder, unspecified: Secondary | ICD-10-CM
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
## 2018-03-22 ENCOUNTER — Ambulatory Visit: Payer: BLUE CROSS/BLUE SHIELD | Admitting: Licensed Clinical Social Worker

## 2018-03-31 ENCOUNTER — Ambulatory Visit: Payer: BLUE CROSS/BLUE SHIELD | Admitting: Psychiatry

## 2018-07-21 ENCOUNTER — Other Ambulatory Visit: Payer: Self-pay

## 2018-07-21 ENCOUNTER — Encounter: Payer: Self-pay | Admitting: Psychiatry

## 2018-07-21 ENCOUNTER — Ambulatory Visit: Payer: BC Managed Care – PPO | Admitting: Psychiatry

## 2018-07-21 VITALS — BP 121/72 | HR 65 | Temp 97.8°F | Wt 168.8 lb

## 2018-07-21 DIAGNOSIS — F410 Panic disorder [episodic paroxysmal anxiety] without agoraphobia: Secondary | ICD-10-CM

## 2018-07-21 DIAGNOSIS — F411 Generalized anxiety disorder: Secondary | ICD-10-CM | POA: Diagnosis not present

## 2018-07-21 DIAGNOSIS — F4310 Post-traumatic stress disorder, unspecified: Secondary | ICD-10-CM

## 2018-07-21 MED ORDER — SERTRALINE HCL 50 MG PO TABS: 50.0000 mg | ORAL_TABLET | Freq: Every day | ORAL | 2 refills | Status: DC

## 2018-07-21 MED ORDER — TRAZODONE HCL 50 MG PO TABS: 50.0000 mg | ORAL_TABLET | Freq: Every evening | ORAL | 2 refills | Status: DC | PRN

## 2018-07-21 NOTE — Patient Instructions (Signed)
Trazodone tablets What is this medicine? TRAZODONE (TRAZ oh done) is used to treat depression. This medicine may be used for other purposes; ask your health care provider or pharmacist if you have questions. COMMON BRAND NAME(S): Desyrel What should I tell my health care provider before I take this medicine? They need to know if you have any of these conditions: -attempted suicide or thinking about it -bipolar disorder -bleeding problems -glaucoma -heart disease, or previous heart attack -irregular heart beat -kidney or liver disease -low levels of sodium in the blood -an unusual or allergic reaction to trazodone, other medicines, foods, dyes or preservatives -pregnant or trying to get pregnant -breast-feeding How should I use this medicine? Take this medicine by mouth with a glass of water. Follow the directions on the prescription label. Take this medicine shortly after a meal or a light snack. Take your medicine at regular intervals. Do not take your medicine more often than directed. Do not stop taking this medicine suddenly except upon the advice of your doctor. Stopping this medicine too quickly may cause serious side effects or your condition may worsen. A special MedGuide will be given to you by the pharmacist with each prescription and refill. Be sure to read this information carefully each time. Talk to your pediatrician regarding the use of this medicine in children. Special care may be needed. Overdosage: If you think you have taken too much of this medicine contact a poison control center or emergency room at once. NOTE: This medicine is only for you. Do not share this medicine with others. What if I miss a dose? If you miss a dose, take it as soon as you can. If it is almost time for your next dose, take only that dose. Do not take double or extra doses. What may interact with this medicine? Do not take this medicine with any of the following medications: -certain medicines  for fungal infections like fluconazole, itraconazole, ketoconazole, posaconazole, voriconazole -cisapride -dofetilide -dronedarone -linezolid -MAOIs like Carbex, Eldepryl, Marplan, Nardil, and Parnate -mesoridazine -methylene blue (injected into a vein) -pimozide -saquinavir -thioridazine This medicine may also interact with the following medications: -alcohol -antiviral medicines for HIV or AIDS -aspirin and aspirin-like medicines -barbiturates like phenobarbital -certain medicines for blood pressure, heart disease, irregular heart beat -certain medicines for depression, anxiety, or psychotic disturbances -certain medicines for migraine headache like almotriptan, eletriptan, frovatriptan, naratriptan, rizatriptan, sumatriptan, zolmitriptan -certain medicines for seizures like carbamazepine and phenytoin -certain medicines for sleep -certain medicines that treat or prevent blood clots like dalteparin, enoxaparin, warfarin -digoxin -fentanyl -lithium -NSAIDS, medicines for pain and inflammation, like ibuprofen or naproxen -other medicines that prolong the QT interval (cause an abnormal heart rhythm) -rasagiline -supplements like St. John's wort, kava kava, valerian -tramadol -tryptophan This list may not describe all possible interactions. Give your health care provider a list of all the medicines, herbs, non-prescription drugs, or dietary supplements you use. Also tell them if you smoke, drink alcohol, or use illegal drugs. Some items may interact with your medicine. What should I watch for while using this medicine? Tell your doctor if your symptoms do not get better or if they get worse. Visit your doctor or health care professional for regular checks on your progress. Because it may take several weeks to see the full effects of this medicine, it is important to continue your treatment as prescribed by your doctor. Patients and their families should watch out for new or worsening  thoughts of suicide or depression. Also watch  out for sudden changes in feelings such as feeling anxious, agitated, panicky, irritable, hostile, aggressive, impulsive, severely restless, overly excited and hyperactive, or not being able to sleep. If this happens, especially at the beginning of treatment or after a change in dose, call your health care professional. Dennis Bast may get drowsy or dizzy. Do not drive, use machinery, or do anything that needs mental alertness until you know how this medicine affects you. Do not stand or sit up quickly, especially if you are an older patient. This reduces the risk of dizzy or fainting spells. Alcohol may interfere with the effect of this medicine. Avoid alcoholic drinks. This medicine may cause dry eyes and blurred vision. If you wear contact lenses you may feel some discomfort. Lubricating drops may help. See your eye doctor if the problem does not go away or is severe. Your mouth may get dry. Chewing sugarless gum, sucking hard candy and drinking plenty of water may help. Contact your doctor if the problem does not go away or is severe. What side effects may I notice from receiving this medicine? Side effects that you should report to your doctor or health care professional as soon as possible: -allergic reactions like skin rash, itching or hives, swelling of the face, lips, or tongue -elevated mood, decreased need for sleep, racing thoughts, impulsive behavior -confusion -fast, irregular heartbeat -feeling faint or lightheaded, falls -feeling agitated, angry, or irritable -loss of balance or coordination -painful or prolonged erections -restlessness, pacing, inability to keep still -suicidal thoughts or other mood changes -tremors -trouble sleeping -seizures -unusual bleeding or bruising Side effects that usually do not require medical attention (report to your doctor or health care professional if they continue or are bothersome): -change in sex drive or  performance -change in appetite or weight -constipation -headache -muscle aches or pains -nausea This list may not describe all possible side effects. Call your doctor for medical advice about side effects. You may report side effects to FDA at 1-800-FDA-1088. Where should I keep my medicine? Keep out of the reach of children. Store at room temperature between 15 and 30 degrees C (59 to 86 degrees F). Protect from light. Keep container tightly closed. Throw away any unused medicine after the expiration date. NOTE: This sheet is a summary. It may not cover all possible information. If you have questions about this medicine, talk to your doctor, pharmacist, or health care provider.  2019 Elsevier/Gold Standard (2017-08-23 17:51:24)

## 2018-07-21 NOTE — Progress Notes (Signed)
[facility] MD OP Progress Note  07/21/2018 9:15 AM Shane [name]  MRN:  [id number]  Chief Complaint: ' I am here for follow up." Chief Complaint    Follow-up     HPI: Shane [name] is a 43 year old Caucasian male, employed part-time, lives in [place], separated, has a history of anxiety, PTSD, insomnia, presented to clinic today for a follow-up visit.   Patient today reports that he has been improving with regards to his mood symptoms.  He continues to struggle with some sadness and anxiety symptoms however reports that he is better able to cope with it.  He is compliant only on his Zoloft.  He stopped taking the Seroquel.  He reports his sleep continues to be restless.  He also has nightmares on a regular basis.  Discussed adding trazodone.  He agrees with plan.  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  He also works part-time with [employer].  He reports he continues to live with his mother who is struggling with breast cancer.  He reports he and his mother have been able to reconnect a lot recently.  He continues to have the psychosocial stressor of dealing with separation from his wife.  He is also having some relationship struggles with his children.  He has started psychotherapy sessions with [facility]-his appointment is coming up next week.  Discussed with patient to sign a consent to release medical records so that we can coordinate care with [facility] therapist.  Patient denies any suicidality.  Patient denies any perceptual disturbances.    Visit Diagnosis:    ICD-10-CM   1. PTSD (post-traumatic stress disorder) F43.10 traZODone (DESYREL) 50 MG tablet    sertraline (ZOLOFT) 50 MG tablet  2. Panic attack F41.0   3. GAD (generalized anxiety disorder) F41.1 sertraline (ZOLOFT) 50 MG tablet    Past Psychiatric History: Reviewed past psychiatric history from my progress note on 12/28/2017.  Past trials of Zoloft.  Past Medical History:  Past  Medical History:  Diagnosis Date  . Allergic rhinitis   . Arthritis   . BPH (benign prostatic hyperplasia)    started on saw palmetto (U[facility] Dr. Yves Dill)  . Cervicalgia   . Environmental allergies   . Irritable bowel syndrome   . Pain in joint, lower leg   . Patellar tendinitis   . Polyp of nasal cavity   . Restless leg     Past Surgical History:  Procedure Laterality Date  . IRRIGATION AND DEBRIDEMENT SEBACEOUS CYST    . KNEE SURGERY Bilateral   . TONSILLECTOMY    . VASECTOMY  2009    Family Psychiatric History: Reviewed family psychiatric history from my progress note on 12/28/2017.  Family History:  Family History  Problem Relation Age of Onset  . Hyperlipidemia Father   . Cardiomyopathy Father        hypertrophic  . Irritable bowel syndrome Father   . Alcohol abuse Father   . Goiter Mother   . Mitral valve prolapse Sister   . Thyroid disease Unknown        maternal side  . Coronary artery disease Unknown        paternal side (coal miners)  . Crohn's disease Unknown        uncle and cousin  . Crohn's disease Paternal Uncle   . Alcohol abuse Paternal Uncle   . Alcohol abuse Cousin   . Anxiety disorder Cousin   . Cancer Neg  Hx     Social History: Reviewed social history from my progress note on 12/28/2017 Social History   Socioeconomic History  . Marital status: Married    Spouse name: tare  . Number of children: 2  . Years of education: Not on file  . Highest education level: Bachelor's degree (e.g., BA, AB, BS)  Occupational History    Comment: unemployed  Social Needs  . Financial resource strain: Not very hard  . Food insecurity:    Worry: Sometimes true    Inability: Sometimes true  . Transportation needs:    Medical: No    Non-medical: No  Tobacco Use  . Smoking status: Former Smoker    Last attempt to quit: 07/03/2003    Years since quitting: 15.0  . Smokeless tobacco: Never Used  Substance and Sexual Activity  . Alcohol use: Not Currently     Comment: Occasional  . Drug use: No  . Sexual activity: Yes  Lifestyle  . Physical activity:    Days per week: 0 days    Minutes per session: 0 min  . Stress: Not on file  Relationships  . Social connections:    Talks on phone: Three times a week    Gets together: Once a week    Attends religious service: More than 4 times per year    Active member of club or organization: Yes    Attends meetings of clubs or organizations: More than 4 times per year    Relationship status: Married  Other Topics Concern  . Not on file  Social History Narrative  . Not on file    Allergies:  Allergies  Allergen Reactions  . Penicillin G Swelling  . Penicillins     REACTION: throat swelling    Metabolic Disorder Labs: No results found for: HGBA1C, MPG No results found for: PROLACTIN No results found for: CHOL, TRIG, HDL, CHOLHDL, VLDL, LDLCALC No results found for: TSH  Therapeutic Level Labs: No results found for: LITHIUM No results found for: VALPROATE No components found for:  CBMZ  Current Medications: Current Outpatient Medications  Medication Sig Dispense Refill  . hydrOXYzine (VISTARIL) 25 MG capsule Take 1-2 capsules (25-50 mg total) by mouth at bedtime as needed for anxiety (sleep). 60 capsule 1  . Multiple Vitamin (MULTIVITAMIN) tablet Take 1 tablet by mouth daily.    . pantoprazole (PROTONIX) 40 MG tablet   2  . Saw Palmetto, Serenoa repens, (SAW PALMETTO PO) Take by mouth.    . sertraline (ZOLOFT) 50 MG tablet Take 1 tablet (50 mg total) by mouth daily. 30 tablet 2  . traZODone (DESYREL) 50 MG tablet Take 1 tablet (50 mg total) by mouth at bedtime as needed for sleep. 30 tablet 2   No current facility-administered medications for this visit.      Musculoskeletal: Strength & Muscle Tone: within normal limits Gait & Station: normal Patient leans: N/A  Psychiatric Specialty Exam: Review of Systems  Psychiatric/Behavioral: Positive for depression. The patient is  nervous/anxious and has insomnia.   All other systems reviewed and are negative.   Blood pressure 121/72, pulse 65, temperature 97.8 F (36.6 C), temperature source Oral, weight 168 lb 12.8 oz (76.6 kg).Body mass index is 24.22 kg/m.  General Appearance: Casual  Eye Contact:  Fair  Speech:  Clear and Coherent  Volume:  Normal  Mood:  Anxious and Dysphoric  Affect:  Congruent  Thought Process:  Goal Directed and Descriptions of Associations: Intact  Orientation:  Full (Time,  Place, and Person)  Thought Content: Logical   Suicidal Thoughts:  No  Homicidal Thoughts:  No  Memory:  Immediate;   Fair Recent;   Fair Remote;   Fair  Judgement:  Fair  Insight:  Fair  Psychomotor Activity:  Normal  Concentration:  Concentration: Fair and Attention Span: Fair  Recall:  [employer] of Knowledge: Fair  Language: Fair  Akathisia:  No  Handed:  Right  AIMS (if indicated): denies tremors, rigidity  Assets:  Communication Skills Desire for Improvement Social Support  ADL's:  Intact  Cognition: WNL  Sleep:  Poor has nightmares   Screenings:   Assessment and Plan: Shane [name] is a 43 year old Caucasian male, employed part-time, Scientist, clinical (histocompatibility and immunogenetics), separated, lives in [place], has a history of anxiety, insomnia, PTSD, GERD, presented to clinic today for a follow-up visit.  Patient is biologically predisposed given his history of trauma, being in [employer], history of mental health problems in his family.  Patient today reports he continues to struggle with some mood symptoms as well as sleep problems.  He is starting psychotherapy sessions with [facility] soon.  He does not want his Zoloft dosage to be readjusted today.  He however would like to have medications for his sleep.  Discussed plan as noted below.  Plan For PTSD- some improvement Zoloft 50 mg p.o. daily. Discontinue Seroquel for noncompliance. Patient is beginning psychotherapy sessions with [facility].  For panic attacks-improving Zoloft  as prescribed  For insomnia-unstable Discontinue Seroquel for noncompliance. Start trazodone 50 mg p.o. nightly as needed Provided medication education.  Discussed with patient to sign a consent to obtain medical records from therapist at [facility].  Follow-up in clinic in 2 to 3 months or sooner if needed.  Patient lives in [place] and hence can only come here every 2 to 3 months due to the distance.  I have spent atleast 25 minutes face to face with patient today. More than 50 % of the time was spent for psychoeducation and supportive psychotherapy and care coordination.  This note was generated in part or whole with voice recognition software. Voice recognition is usually quite accurate but there are transcription errors that can and very often do occur. I apologize for any typographical errors that were not detected and corrected.                 Ursula Alert, MD 07/21/2018, 9:15 AM

## 2018-07-31 ENCOUNTER — Telehealth: Payer: Self-pay

## 2018-08-01 NOTE — Telephone Encounter (Signed)
Error

## 2019-01-15 DIAGNOSIS — R0989 Other specified symptoms and signs involving the circulatory and respiratory systems: Secondary | ICD-10-CM | POA: Insufficient documentation

## 2019-01-15 DIAGNOSIS — H938X3 Other specified disorders of ear, bilateral: Secondary | ICD-10-CM | POA: Insufficient documentation

## 2019-01-15 DIAGNOSIS — R09A2 Foreign body sensation, throat: Secondary | ICD-10-CM | POA: Insufficient documentation

## 2019-01-15 DIAGNOSIS — H9313 Tinnitus, bilateral: Secondary | ICD-10-CM | POA: Insufficient documentation

## 2019-01-15 DIAGNOSIS — R490 Dysphonia: Secondary | ICD-10-CM | POA: Insufficient documentation

## 2019-01-27 ENCOUNTER — Other Ambulatory Visit: Payer: Self-pay | Admitting: Psychiatry

## 2019-01-27 DIAGNOSIS — F4310 Post-traumatic stress disorder, unspecified: Secondary | ICD-10-CM

## 2019-01-27 DIAGNOSIS — F411 Generalized anxiety disorder: Secondary | ICD-10-CM

## 2019-02-02 ENCOUNTER — Telehealth: Payer: Self-pay

## 2019-02-02 NOTE — Telephone Encounter (Signed)
He was dismissed since he has not followed up in a long time. Please let him know clinic policy of why we dismiss patients when they do not follow up for treatment for a long time. But if he wants to be seen , please call him back put him back on schedule and I can send medications . Please let me know. thanks

## 2019-02-02 NOTE — Telephone Encounter (Signed)
Patient called this morning for a refill on medication and to see if we are seeing patients. I advised him that we are seeing patients virtually and attempted to make an appointment. A message came up that patient was discharged, when I told him this he was upset. Patient states that he never got a call after his last visit in January and has not missed any appointments. Patient would like an explanation as to why he can not be seen.

## 2019-02-05 NOTE — Telephone Encounter (Signed)
I called [facility] this morning and spoke to [facility], she is going to remove the block and get him scheduled.

## 2019-02-06 ENCOUNTER — Telehealth: Payer: Self-pay

## 2019-02-06 DIAGNOSIS — F4310 Post-traumatic stress disorder, unspecified: Secondary | ICD-10-CM

## 2019-02-06 DIAGNOSIS — F411 Generalized anxiety disorder: Secondary | ICD-10-CM

## 2019-02-06 MED ORDER — SERTRALINE HCL 50 MG PO TABS: 50.0000 mg | ORAL_TABLET | Freq: Every day | ORAL | 2 refills | Status: DC

## 2019-02-06 NOTE — Telephone Encounter (Signed)
pt called states he needs a refill on medications to get to his next appointment.

## 2019-02-06 NOTE — Telephone Encounter (Signed)
Sent Zoloft

## 2019-02-06 NOTE — Telephone Encounter (Signed)
received a fax from phamracy requesting a refill on the sertraline    sertraline (ZOLOFT) 50 MG tablet Medication Date: 07/21/2018 Department: [facility] Ordering/Authorizing: Ursula Alert, MD  Order Providers  Prescribing Provider Encounter Provider  Ursula Alert, MD Ursula Alert, MD  Outpatient Medication Detail   Disp Refills Start End   sertraline (ZOLOFT) 50 MG tablet 30 tablet 2 07/21/2018    Sig - Route: Take 1 tablet (50 mg total) by mouth daily. - Oral   Sent to pharmacy as: sertraline (ZOLOFT) 50 MG tablet   E-Prescribing Status: Receipt confirmed by pharmacy (07/21/2018 9:05 AM EST)

## 2019-02-27 ENCOUNTER — Ambulatory Visit (INDEPENDENT_AMBULATORY_CARE_PROVIDER_SITE_OTHER): Payer: BC Managed Care – PPO | Admitting: Psychiatry

## 2019-02-27 ENCOUNTER — Encounter: Payer: Self-pay | Admitting: Psychiatry

## 2019-02-27 ENCOUNTER — Other Ambulatory Visit: Payer: Self-pay

## 2019-02-27 DIAGNOSIS — F4310 Post-traumatic stress disorder, unspecified: Secondary | ICD-10-CM

## 2019-02-27 DIAGNOSIS — F410 Panic disorder [episodic paroxysmal anxiety] without agoraphobia: Secondary | ICD-10-CM

## 2019-02-27 DIAGNOSIS — F411 Generalized anxiety disorder: Secondary | ICD-10-CM | POA: Diagnosis not present

## 2019-02-27 MED ORDER — SERTRALINE HCL 50 MG PO TABS: 75.0000 mg | ORAL_TABLET | Freq: Every day | ORAL | 0 refills | Status: DC

## 2019-02-27 NOTE — Progress Notes (Signed)
Virtual Visit via Video Note  I connected with [name] on 02/27/19 at  3:45 PM EDT by a video enabled telemedicine application and verified that I am speaking with the correct person using two identifiers.   I discussed the limitations of evaluation and management by telemedicine and the availability of in person appointments. The patient expressed understanding and agreed to proceed.    I discussed the assessment and treatment plan with the patient. The patient was provided an opportunity to ask questions and all were answered. The patient agreed with the plan and demonstrated an understanding of the instructions.   The patient was advised to call back or seek an in-person evaluation if the symptoms worsen or if the condition fails to improve as anticipated.   [facility] MD OP Progress Note  02/27/2019 5:20 PM Merrell Borsuk  MRN:  [id number]  Chief Complaint:  Chief Complaint    Follow-up     HPI: Nadeem is a 6 Caucasian male, currently employed, lives in [place], separated, has a history of PTSD, GAD, insomnia was evaluated by telemedicine today.  Patient was last seen on 07/21/2018.  Patient reports that he was making progress on the Zoloft and hence did not follow-up as recommended.  Patient reports he currently struggles with some anxiety symptoms more so because of his separation and the upcoming divorce and custody agreements.  He reports his wife is going to file for legal divorce petition this Friday.  He reports he was not allowed to see her children for a while however since the past 2 weeks he has started seeing him and that is a big relief.  He reports he wants to do what ever is right to make sure he gets to see his children and be a dad.  Patient reports the Zoloft is helpful with his anxiety however he is interested in a dosage increase.  Patient was advised to complete the GAD 7 today.  Patient reports he continues to work with his therapist- Mr. Arlana Pouch in [place].  He  reports therapy is going well.  He continues to stay with his mother and tries to support her.  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  Patient denies any suicidality, homicidality or perceptual disturbances.  Patient denies any substance abuse problems.  Patient denies any other concerns today. Visit Diagnosis:    ICD-10-CM   1. PTSD (post-traumatic stress disorder)  F43.10 sertraline (ZOLOFT) 50 MG tablet  2. Panic attack  F41.0   3. GAD (generalized anxiety disorder)  F41.1 sertraline (ZOLOFT) 50 MG tablet    Past Psychiatric History: I have reviewed past psychiatric history from my progress note on 12/28/2017.  Past trials of Zoloft.  Past Medical History:  Past Medical History:  Diagnosis Date  . Allergic rhinitis   . Arthritis   . BPH (benign prostatic hyperplasia)    started on saw palmetto ([facility] Dr. Yves Dill)  . Cervicalgia   . Environmental allergies   . Irritable bowel syndrome   . Pain in joint, lower leg   . Patellar tendinitis   . Polyp of nasal cavity   . Restless leg     Past Surgical History:  Procedure Laterality Date  . IRRIGATION AND DEBRIDEMENT SEBACEOUS CYST    . KNEE SURGERY Bilateral   . TONSILLECTOMY    . VASECTOMY  2009    Family Psychiatric History: I have reviewed family psychiatric  history from my progress note on 12/28/2017.  Family History:  Family History  Problem Relation Age of Onset  . Hyperlipidemia Father   . Cardiomyopathy Father        hypertrophic  . Irritable bowel syndrome Father   . Alcohol abuse Father   . Goiter Mother   . Mitral valve prolapse Sister   . Thyroid disease Unknown        maternal side  . Coronary artery disease Unknown        paternal side (coal miners)  . Crohn's disease Unknown        uncle and cousin  . Crohn's disease Paternal Uncle   . Alcohol abuse Paternal Uncle   . Alcohol abuse Cousin   . Anxiety disorder Cousin   .  Cancer Neg Hx     Social History: I have reviewed social history from my progress note on 12/28/2017. Social History   Socioeconomic History  . Marital status: Married    Spouse name: tare  . Number of children: 2  . Years of education: Not on file  . Highest education level: Bachelor's degree (e.g., BA, AB, BS)  Occupational History    Comment: unemployed  Social Needs  . Financial resource strain: Not very hard  . Food insecurity    Worry: Sometimes true    Inability: Sometimes true  . Transportation needs    Medical: No    Non-medical: No  Tobacco Use  . Smoking status: Former Smoker    Quit date: 07/03/2003    Years since quitting: 15.6  . Smokeless tobacco: Never Used  Substance and Sexual Activity  . Alcohol use: Not Currently    Comment: Occasional  . Drug use: No  . Sexual activity: Yes  Lifestyle  . Physical activity    Days per week: 0 days    Minutes per session: 0 min  . Stress: Not on file  Relationships  . Social Herbalist on phone: Three times a week    Gets together: Once a week    Attends religious service: More than 4 times per year    Active member of club or organization: Yes    Attends meetings of clubs or organizations: More than 4 times per year    Relationship status: Married  Other Topics Concern  . Not on file  Social History Narrative  . Not on file    Allergies:  Allergies  Allergen Reactions  . Penicillin G Swelling  . Penicillins     REACTION: throat swelling    Metabolic Disorder Labs: No results found for: HGBA1C, MPG No results found for: PROLACTIN No results found for: CHOL, TRIG, HDL, CHOLHDL, VLDL, LDLCALC No results found for: TSH  Therapeutic Level Labs: No results found for: LITHIUM No results found for: VALPROATE No components found for:  CBMZ  Current Medications: Current Outpatient Medications  Medication Sig Dispense Refill  . fluticasone (FLONASE) 50 MCG/ACT nasal spray Place into the nose.     . tamsulosin (FLOMAX) 0.4 MG CAPS capsule Take by mouth.    . hydrOXYzine (VISTARIL) 25 MG capsule Take 1-2 capsules (25-50 mg total) by mouth at bedtime as needed for anxiety (sleep). 60 capsule 1  . Multiple Vitamin (MULTIVITAMIN) tablet Take 1 tablet by mouth daily.    . pantoprazole (PROTONIX) 40 MG tablet   2  . Saw Palmetto, Serenoa repens, (SAW PALMETTO PO) Take by mouth.    . sertraline (ZOLOFT) 50 MG tablet Take  1.5 tablets (75 mg total) by mouth daily. 135 tablet 0   No current facility-administered medications for this visit.      Musculoskeletal: Strength & Muscle Tone: UTA Gait & Station: normal Patient leans: N/A  Psychiatric Specialty Exam: Review of Systems  Psychiatric/Behavioral: The patient is nervous/anxious.   All other systems reviewed and are negative.   There were no vitals taken for this visit.There is no height or weight on file to calculate BMI.  General Appearance: Casual  Eye Contact:  Fair  Speech:  Clear and Coherent  Volume:  Normal  Mood:  Anxious  Affect:  Congruent  Thought Process:  Goal Directed and Descriptions of Associations: Intact  Orientation:  Full (Time, Place, and Person)  Thought Content: Logical   Suicidal Thoughts:  No  Homicidal Thoughts:  No  Memory:  Immediate;   Fair Recent;   Fair Remote;   Fair  Judgement:  Fair  Insight:  Fair  Psychomotor Activity:  Normal  Concentration:  Concentration: Fair and Attention Span: Fair  Recall:  [employer] of Knowledge: Fair  Language: Fair  Akathisia:  No  Handed:  Right  AIMS (if indicated):Denies tremors, rigidity  Assets:  Communication Skills Desire for Improvement Housing Social Support  ADL's:  Intact  Cognition: WNL  Sleep:  Fair   Screenings: GAD-7     Office Visit from 02/27/2019 in [facility]  Total GAD-7 Score  10    PHQ2-9     Office Visit from 02/27/2019 in [facility]  PHQ-2 Total Score  1        Assessment and Plan: Leovardo is a 43 year old Caucasian male, employed, ex-veteran, separated, [place] in [place], has a history of anxiety, insomnia, PTSD, GERD, was evaluated by telemedicine today.  Patient is biologically predisposed given his history of trauma, being in [employer], history of mental health problems in his family.  He is currently making progress however continues to struggle with anxiety symptoms and will benefit from medication readjustment.  Plan PTSD- some improvement Zoloft as prescribed Continue psychotherapy session with therapist Mr. Arlana Pouch.  For anxiety disorder- some progress GAD 7 equals 10 Increase Zoloft to 75 mg p.o. daily  Follow-up in clinic in 6 to 8 weeks or sooner if needed.  October 16 at 8:30 AM  I have spent atleast 15 minutes non face to face with patient today. More than 50 % of the time was spent for psychoeducation and supportive psychotherapy and care coordination. This note was generated in part or whole with voice recognition software. Voice recognition is usually quite accurate but there are transcription errors that can and very often do occur. I apologize for any typographical errors that were not detected and corrected.       Ursula Alert, MD 02/27/2019, 5:20 PM

## 2019-03-01 ENCOUNTER — Ambulatory Visit: Payer: BC Managed Care – PPO | Admitting: Psychiatry

## 2019-04-13 ENCOUNTER — Ambulatory Visit (INDEPENDENT_AMBULATORY_CARE_PROVIDER_SITE_OTHER): Payer: BC Managed Care – PPO | Admitting: Psychiatry

## 2019-04-13 ENCOUNTER — Encounter: Payer: Self-pay | Admitting: Psychiatry

## 2019-04-13 ENCOUNTER — Other Ambulatory Visit: Payer: Self-pay

## 2019-04-13 DIAGNOSIS — F411 Generalized anxiety disorder: Secondary | ICD-10-CM

## 2019-04-13 DIAGNOSIS — F4310 Post-traumatic stress disorder, unspecified: Secondary | ICD-10-CM | POA: Diagnosis not present

## 2019-04-13 DIAGNOSIS — F410 Panic disorder [episodic paroxysmal anxiety] without agoraphobia: Secondary | ICD-10-CM

## 2019-04-13 NOTE — Progress Notes (Signed)
Virtual Visit via Video [name]  I connected with Shane [name] on 04/13/19 at  8:30 AM EDT by a video enabled telemedicine application and verified that I am speaking with the correct person using two identifiers.   I discussed the limitations of evaluation and management by telemedicine and the availability of in person appointments. The patient expressed understanding and agreed to proceed   I discussed the assessment and treatment plan with the patient. The patient was provided an opportunity to ask questions and all were answered. The patient agreed with the plan and demonstrated an understanding of the instructions.   The patient was advised to call back or seek an in-person evaluation if the symptoms worsen or if the condition fails to improve as anticipated.  Shane [name]  04/13/2019 12:44 PM Shane [name]  MRN:  [id number]  Chief Complaint:  Chief Complaint    Follow-up     HPI: Shane [name] is a 43 year old Caucasian male currently employed, lives in [place], separated, has a history of PTSD, GAD, insomnia was evaluated by telemedicine today.  A video call was initiated however due to connection problem it had to be changed to a phone call.  Patient today reports he is currently making progress on the increased dosage of Zoloft.  He currently takes 75 mg Zoloft daily.  He reports that has definitely helped a lot with his anxiety symptoms.  He denies any side effects to the medications.  Patient reports sleep as good.  He takes melatonin over-the-counter which helps.  Patient denies any suicidality, homicidality or perceptual disturbances.  He continues to work with his therapist and therapy is going well.  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  He reports that has been very helpful since it came with pay raise.  Patient denies any other concerns today.    Visit Diagnosis:    ICD-10-CM   1. PTSD (post-traumatic stress disorder)   F43.10   2. Panic attack  F41.0   3. GAD (generalized anxiety disorder)  F41.1     Past Psychiatric History: I have reviewed past psychiatric history from my progress [name] on 12/28/2017.  Past trials of Zoloft.  Past Medical History:  Past Medical History:  Diagnosis Date  . Allergic rhinitis   . Arthritis   . BPH (benign prostatic hyperplasia)    started on saw palmetto ([facility] Dr. Yves Dill)  . Cervicalgia   . Environmental allergies   . Irritable bowel syndrome   . Pain in joint, lower leg   . Patellar tendinitis   . Polyp of nasal cavity   . Restless leg     Past Surgical History:  Procedure Laterality Date  . IRRIGATION AND DEBRIDEMENT SEBACEOUS CYST    . KNEE SURGERY Bilateral   . TONSILLECTOMY    . VASECTOMY  2009    Family Psychiatric History: I have reviewed family psychiatric history from my progress [name] on 12/28/2017.  Family History:  Family History  Problem Relation Age of Onset  . Hyperlipidemia Father   . Cardiomyopathy Father        hypertrophic  . Irritable bowel syndrome Father   . Alcohol abuse Father   . Goiter Mother   . Mitral valve prolapse Sister   . Thyroid disease Unknown        maternal side  . Coronary artery disease Unknown        paternal side (coal miners)  . Crohn's disease Unknown  uncle and cousin  . Crohn's disease Paternal Uncle   . Alcohol abuse Paternal Uncle   . Alcohol abuse Cousin   . Anxiety disorder Cousin   . Cancer Neg Hx     Social History: I have reviewed social history from my progress [name] on 12/28/2017. Social History   Socioeconomic History  . Marital status: Married    Spouse name: tare  . Number of children: 2  . Years of education: Not on file  . Highest education level: Bachelor's degree (e.g., BA, AB, BS)  Occupational History    Comment: unemployed  Social Needs  . Financial resource strain: Not very hard  . Food insecurity    Worry: Sometimes true    Inability: Sometimes true  . Transportation  needs    Medical: No    Non-medical: No  Tobacco Use  . Smoking status: Former Smoker    Quit date: 07/03/2003    Years since quitting: 15.7  . Smokeless tobacco: Never Used  Substance and Sexual Activity  . Alcohol use: Not Currently    Comment: Occasional  . Drug use: No  . Sexual activity: Yes  Lifestyle  . Physical activity    Days per week: 0 days    Minutes per session: 0 min  . Stress: Not on file  Relationships  . Social Herbalist on phone: Three times a week    Gets together: Once a week    Attends religious service: More than 4 times per year    Active member of club or organization: Yes    Attends meetings of clubs or organizations: More than 4 times per year    Relationship status: Married  Other Topics Concern  . Not on file  Social History Narrative  . Not on file    Allergies:  Allergies  Allergen Reactions  . Penicillin G Swelling  . Penicillins     REACTION: throat swelling    Metabolic Disorder Labs: No results found for: HGBA1C, MPG No results found for: PROLACTIN No results found for: CHOL, TRIG, HDL, CHOLHDL, VLDL, LDLCALC No results found for: TSH  Therapeutic Level Labs: No results found for: LITHIUM No results found for: VALPROATE No components found for:  CBMZ  Current Medications: Current Outpatient Medications  Medication Sig Dispense Refill  . fluticasone (FLONASE) 50 MCG/ACT nasal spray Place into the nose.    . hydrOXYzine (VISTARIL) 25 MG capsule Take 1-2 capsules (25-50 mg total) by mouth at bedtime as needed for anxiety (sleep). 60 capsule 1  . Multiple Vitamin (MULTIVITAMIN) tablet Take 1 tablet by mouth daily.    . pantoprazole (PROTONIX) 40 MG tablet   2  . Saw Palmetto, Serenoa repens, (SAW PALMETTO PO) Take by mouth.    . sertraline (ZOLOFT) 50 MG tablet Take 1.5 tablets (75 mg total) by mouth daily. 135 tablet 0  . tamsulosin (FLOMAX) 0.4 MG CAPS capsule Take by mouth.     No current facility-administered  medications for this visit.      Musculoskeletal: Strength & Muscle Tone: UTA Gait & Station: Reports as WNL Patient leans: N/A  Psychiatric Specialty Exam: Review of Systems  Psychiatric/Behavioral: The patient is nervous/anxious (improved).   All other systems reviewed and are negative.   There were no vitals taken for this visit.There is no height or weight on file to calculate BMI.  General Appearance: UTA  Eye Contact:  UTA  Speech:  Clear and Coherent  Volume:  Normal  Mood:  Anxious improving  Affect:  UTA  Thought Process:  Goal Directed and Descriptions of Associations: Intact  Orientation:  Full (Time, Place, and Person)  Thought Content: Logical   Suicidal Thoughts:  No  Homicidal Thoughts:  No  Memory:  Immediate;   Fair Recent;   Fair Remote;   Fair  Judgement:  Fair  Insight:  Fair  Psychomotor Activity:  UTA  Concentration:  Concentration: Fair and Attention Span: Fair  Recall:  [employer] of Knowledge: Fair  Language: Fair  Akathisia:  No  Handed:  Right  AIMS (if indicated): Denies tremors, rigidity  Assets:  Communication Skills Desire for Improvement Housing Social Support  ADL's:  Intact  Cognition: WNL  Sleep:  Fair   Screenings: GAD-7     Office Visit from 02/27/2019 in [facility]  Total GAD-7 Score  10    PHQ2-9     Office Visit from 02/27/2019 in [facility]  PHQ-2 Total Score  1       Assessment and Plan: Haedyn is a 43 year old Caucasian male, employed, ex - veteran, separated, lives in [place], has a history of insomnia, PTSD, GERD, anxiety was evaluated by telemedicine today.  He is biologically predisposed given his history of trauma, being in [employer], history of mental health problems in his family.  Patient is currently making progress on the medications.  Plan as noted below.  Plan PTSD-improving Zoloft as prescribed Continue melatonin 5 mg p.o. at bedtime for  sleep Continue psychotherapy sessions with therapist Mr. Arlana Pouch.  Anxiety disorder-improving Zoloft 75 mg p.o. daily  Follow-up in clinic in 2 months or sooner if needed.  December 18 at 10 AM  I have spent atleast 15 minutes non face to face with patient today. More than 50 % of the time was spent for psychoeducation and supportive psychotherapy and care coordination. This [name] was generated in part or whole with voice recognition software. Voice recognition is usually quite accurate but there are transcription errors that can and very often do occur. I apologize for any typographical errors that were not detected and corrected.       Ursula Alert, MD 04/13/2019, 12:44 PM

## 2019-05-11 DIAGNOSIS — R3912 Poor urinary stream: Secondary | ICD-10-CM | POA: Insufficient documentation

## 2019-05-11 DIAGNOSIS — K649 Unspecified hemorrhoids: Secondary | ICD-10-CM | POA: Insufficient documentation

## 2019-05-11 DIAGNOSIS — R0689 Other abnormalities of breathing: Secondary | ICD-10-CM | POA: Insufficient documentation

## 2019-06-15 ENCOUNTER — Other Ambulatory Visit: Payer: Self-pay

## 2019-06-15 ENCOUNTER — Ambulatory Visit (INDEPENDENT_AMBULATORY_CARE_PROVIDER_SITE_OTHER): Payer: BC Managed Care – PPO | Admitting: Psychiatry

## 2019-06-15 ENCOUNTER — Encounter: Payer: Self-pay | Admitting: Psychiatry

## 2019-06-15 DIAGNOSIS — F4310 Post-traumatic stress disorder, unspecified: Secondary | ICD-10-CM

## 2019-06-15 DIAGNOSIS — F411 Generalized anxiety disorder: Secondary | ICD-10-CM

## 2019-06-15 DIAGNOSIS — F410 Panic disorder [episodic paroxysmal anxiety] without agoraphobia: Secondary | ICD-10-CM | POA: Diagnosis not present

## 2019-06-15 MED ORDER — SERTRALINE HCL 50 MG PO TABS: 75.0000 mg | ORAL_TABLET | Freq: Every day | ORAL | 1 refills | Status: DC

## 2019-06-15 NOTE — Progress Notes (Signed)
Virtual Visit via Video Note  I connected with Shane [name] on 06/15/19 at 10:00 AM EST by a video enabled telemedicine application and verified that I am speaking with the correct person using two identifiers.   I discussed the limitations of evaluation and management by telemedicine and the availability of in person appointments. The patient expressed understanding and agreed to proceed.     I discussed the assessment and treatment plan with the patient. The patient was provided an opportunity to ask questions and all were answered. The patient agreed with the plan and demonstrated an understanding of the instructions.   The patient was advised to call back or seek an in-person evaluation if the symptoms worsen or if the condition fails to improve as anticipated.   [facility] MD OP Progress Note  06/15/2019 10:25 AM Kean Gautreau  MRN:  [id number]  Chief Complaint:  Chief Complaint    Follow-up     HPI: Shane [name] is a 43 year old Caucasian male, currently employed, lives in [place], separated, has a history of PTSD, GAD, insomnia was evaluated by telemedicine today.  Patient today reports he is currently doing well with regards to his mood symptoms.  He denies any significant mood swings.  He denies any sadness.  He denies any nervousness.  Patient reports sleep is good.  Patient reports he takes magnesium as well as melatonin which helps.  He continues to be compliant on Zoloft as prescribed.  He denies side effects.  Patient reports he was recently evaluated by his provider for muscular pain in his chest as well as sore throat.  He had work-up done and he was advised to get speech therapy.  He is recovering.  Patient reports he and his wife with whom he is separated signed a divorce agreement recently.  That has been a relief.  Patient reports he is currently involved in the care of the children.  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  Visit Diagnosis:   ICD-10-CM   1. PTSD (post-traumatic stress disorder)  F43.10 sertraline (ZOLOFT) 50 MG tablet  2. Panic attack  F41.0   3. GAD (generalized anxiety disorder)  F41.1 sertraline (ZOLOFT) 50 MG tablet    Past Psychiatric History: Reviewed past psychiatric history from my progress note on 12/28/2017.  Past trials of Zoloft.  Past Medical History:  Past Medical History:  Diagnosis Date  . Allergic rhinitis   . Arthritis   . BPH (benign prostatic hyperplasia)    started on saw palmetto ([facility] Dr[facility] Yves Dill)  . Cervicalgia   . Environmental allergies   . Irritable bowel syndrome   . Pain in joint, lower leg   . Patellar tendinitis   . Polyp of nasal cavity   . Restless leg     Past Surgical History:  Procedure Laterality Date  . IRRIGATION AND DEBRIDEMENT SEBACEOUS CYST    . KNEE SURGERY Bilateral   . TONSILLECTOMY    . VASECTOMY  2009    Family Psychiatric History: Reviewed family psychiatric history from my progress note on 12/28/2017.  Family History:  Family History  Problem Relation Age of Onset  . Hyperlipidemia Father   . Cardiomyopathy Father        hypertrophic  . Irritable bowel syndrome Father   . Alcohol abuse Father   . Goiter Mother   . Mitral valve prolapse Sister   . Thyroid disease Unknown        maternal side  . Coronary artery disease  Unknown        paternal side (coal miners)  . Crohn's disease Unknown        uncle and cousin  . Crohn's disease Paternal Uncle   . Alcohol abuse Paternal Uncle   . Alcohol abuse Cousin   . Anxiety disorder Cousin   . Cancer Neg Hx     Social History: Reviewed social history from my progress note on 12/28/2017. Social History   Socioeconomic History  . Marital status: Married    Spouse name: tare  . Number of children: 2  . Years of education: Not on file  . Highest education level: Bachelor's degree (e.g., BA, AB, BS)  Occupational History    Comment: unemployed  Tobacco Use  . Smoking status: Former Smoker    Quit  date: 07/03/2003    Years since quitting: 15.9  . Smokeless tobacco: Never Used  Substance and Sexual Activity  . Alcohol use: Not Currently    Comment: Occasional  . Drug use: No  . Sexual activity: Yes  Other Topics Concern  . Not on file  Social History Narrative  . Not on file   Social Determinants of Health   Financial Resource Strain:   . Difficulty of Paying Living Expenses: Not on file  Food Insecurity:   . Worried About Charity fundraiser in the Last Year: Not on file  . Ran Out of Food in the Last Year: Not on file  Transportation Needs:   . Lack of Transportation (Medical): Not on file  . Lack of Transportation (Non-Medical): Not on file  Physical Activity:   . Days of Exercise per Week: Not on file  . Minutes of Exercise per Session: Not on file  Stress:   . Feeling of Stress : Not on file  Social Connections:   . Frequency of Communication with Friends and Family: Not on file  . Frequency of Social Gatherings with Friends and Family: Not on file  . Attends Religious Services: Not on file  . Active Member of Clubs or Organizations: Not on file  . Attends Archivist Meetings: Not on file  . Marital Status: Not on file    Allergies:  Allergies  Allergen Reactions  . Penicillin G Swelling  . Penicillins     REACTION: throat swelling    Metabolic Disorder Labs: No results found for: HGBA1C, MPG No results found for: PROLACTIN No results found for: CHOL, TRIG, HDL, CHOLHDL, VLDL, LDLCALC No results found for: TSH  Therapeutic Level Labs: No results found for: LITHIUM No results found for: VALPROATE No components found for:  CBMZ  Current Medications: Current Outpatient Medications  Medication Sig Dispense Refill  . fluticasone (FLONASE) 50 MCG/ACT nasal spray Place into the nose.    . hydrOXYzine (VISTARIL) 25 MG capsule Take 1-2 capsules (25-50 mg total) by mouth at bedtime as needed for anxiety (sleep). 60 capsule 1  . magnesium oxide  (MAG-OX) 400 MG tablet Take 400 mg by mouth daily.    . Multiple Vitamin (MULTIVITAMIN) tablet Take 1 tablet by mouth daily.    . pantoprazole (PROTONIX) 40 MG tablet   2  . Saw Palmetto, Serenoa repens, (SAW PALMETTO PO) Take by mouth.    . sertraline (ZOLOFT) 50 MG tablet Take 1.5 tablets (75 mg total) by mouth daily. 135 tablet 1  . tamsulosin (FLOMAX) 0.4 MG CAPS capsule Take by mouth.     No current facility-administered medications for this visit.     Musculoskeletal:  Strength & Muscle Tone: UTA Gait & Station: normal Patient leans: N/A  Psychiatric Specialty Exam: Review of Systems  HENT: Positive for sore throat.   Musculoskeletal: Positive for myalgias.  Psychiatric/Behavioral: Negative for agitation, behavioral problems, confusion, decreased concentration, dysphoric mood, hallucinations, self-injury, sleep disturbance and suicidal ideas. The patient is not nervous/anxious and is not hyperactive.   All other systems reviewed and are negative.   There were no vitals taken for this visit.There is no height or weight on file to calculate BMI.  General Appearance: Casual  Eye Contact:  Fair  Speech:  Clear and Coherent  Volume:  Normal  Mood:  Euthymic  Affect:  Congruent  Thought Process:  Goal Directed and Descriptions of Associations: Intact  Orientation:  Full (Time, Place, and Person)  Thought Content: Logical   Suicidal Thoughts:  No  Homicidal Thoughts:  No  Memory:  Immediate;   Fair Recent;   Fair Remote;   Fair  Judgement:  Fair  Insight:  Fair  Psychomotor Activity:  Normal  Concentration:  Concentration: Fair and Attention Span: Fair  Recall:  [employer] of Knowledge: Fair  Language: Fair  Akathisia:  No  Handed:  Right  AIMS (if indicated): Denies tremors, rigidity  Assets:  Communication Skills Desire for Improvement Social Support  ADL's:  Intact  Cognition: WNL  Sleep:  Fair   Screenings: GAD-7     Office Visit from 02/27/2019 in [facility]  Total GAD-7 Score  10    PHQ2-9     Office Visit from 02/27/2019 in [facility]  PHQ-2 Total Score  1       Assessment and Plan: Hobson is a 43 year old Caucasian male, employed, exveteran, separated, lives in [place], has a history of insomnia, PTSD, panic attacks, GAD, GERD was evaluated by telemedicine today.  Patient is biologically predisposed given his history of trauma, being in [employer] and history of mental health problems in his family.  Patient also with psychosocial stressors of the pandemic and relationship struggles with his wife , currently in the process of getting a divorce.  Patient is currently making progress on the current medication regimen.  Plan as noted below.  Plan PTSD-stable Zoloft as prescribed Melatonin 5 mg p.o. nightly at bedtime. Patient to continue psychotherapy session with therapist Mr. Arlana Pouch   Generalized anxiety disorder-stable Zoloft as prescribed  Panic attacks-stable Continue psychotherapy sessions.  Follow-up in clinic in 3 to 4 months or sooner if needed.  April 16 at 9 AM  I have spent atleast 15 minutes non face to face with patient today. More than 50 % of the time was spent for psychoeducation and supportive psychotherapy and care coordination. This note was generated in part or whole with voice recognition software. Voice recognition is usually quite accurate but there are transcription errors that can and very often do occur. I apologize for any typographical errors that were not detected and corrected.         Ursula Alert, MD 06/15/2019, 10:25 AM

## 2019-07-09 ENCOUNTER — Other Ambulatory Visit: Payer: Self-pay | Admitting: Psychiatry

## 2019-07-09 DIAGNOSIS — F411 Generalized anxiety disorder: Secondary | ICD-10-CM

## 2019-07-09 DIAGNOSIS — F4310 Post-traumatic stress disorder, unspecified: Secondary | ICD-10-CM

## 2019-10-12 ENCOUNTER — Other Ambulatory Visit: Payer: Self-pay

## 2019-10-12 ENCOUNTER — Telehealth (INDEPENDENT_AMBULATORY_CARE_PROVIDER_SITE_OTHER): Payer: BC Managed Care – PPO | Admitting: Psychiatry

## 2019-10-12 ENCOUNTER — Encounter: Payer: Self-pay | Admitting: Psychiatry

## 2019-10-12 DIAGNOSIS — F410 Panic disorder [episodic paroxysmal anxiety] without agoraphobia: Secondary | ICD-10-CM | POA: Diagnosis not present

## 2019-10-12 DIAGNOSIS — F411 Generalized anxiety disorder: Secondary | ICD-10-CM | POA: Diagnosis not present

## 2019-10-12 DIAGNOSIS — F4310 Post-traumatic stress disorder, unspecified: Secondary | ICD-10-CM

## 2019-10-12 MED ORDER — SERTRALINE HCL 50 MG PO TABS: 75.0000 mg | ORAL_TABLET | Freq: Every day | ORAL | 1 refills | Status: DC

## 2019-10-12 NOTE — Progress Notes (Signed)
Provider Location : [facility] Patient Location : Home  Virtual Visit via Video Note  I connected with [name] on 10/12/19 at  9:00 AM EDT by a video enabled telemedicine application and verified that I am speaking with the correct person using two identifiers.   I discussed the limitations of evaluation and management by telemedicine and the availability of in person appointments. The patient expressed understanding and agreed to proceed.     I discussed the assessment and treatment plan with the patient. The patient was provided an opportunity to ask questions and all were answered. The patient agreed with the plan and demonstrated an understanding of the instructions.   The patient was advised to call back or seek an in-person evaluation if the symptoms worsen or if the condition fails to improve as anticipated.  [facility] MD OP Progress Note  10/12/2019 9:14 AM Shane [name]  MRN:  [id number]  Chief Complaint:  Chief Complaint    Follow-up     HPI: Shane [name] is a 44 year old Caucasian male currently employed, lives in [place] separated, has a history of PTSD, GAD, insomnia was evaluated by telemedicine today.  Patient today reports he is currently stable on current medication regimen.  He denies any significant anxiety symptoms.  He has better control of his nervousness and mood than before.  He reports sleep continues to be good.  He is compliant on the Zoloft as prescribed.  He denies side effects.  Patient denies any suicidality, homicidality or perceptual disturbances.  Patient reports he has been following up with his psychotherapist already on an as-needed basis now.  He reports work is going well.  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     Visit Diagnosis:    ICD-10-CM   1. PTSD (post-traumatic stress disorder)  F43.10 sertraline (ZOLOFT) 50 MG tablet  2. Panic attack  F41.0   3. GAD (generalized anxiety disorder)  F41.1 sertraline (ZOLOFT) 50 MG  tablet    Past Psychiatric History: I have reviewed past psychiatric history from my progress note on 12/28/2017.  Past trials of Zoloft  Past Medical History:  Past Medical History:  Diagnosis Date  . Allergic rhinitis   . Arthritis   . BPH (benign prostatic hyperplasia)    started on saw palmetto ([facility] Dr. Yves Dill)  . Cervicalgia   . Environmental allergies   . Irritable bowel syndrome   . Pain in joint, lower leg   . Patellar tendinitis   . Polyp of nasal cavity   . Restless leg     Past Surgical History:  Procedure Laterality Date  . IRRIGATION AND DEBRIDEMENT SEBACEOUS CYST    . KNEE SURGERY Bilateral   . TONSILLECTOMY    . VASECTOMY  2009    Family Psychiatric History: I have reviewed family psychiatric history from my progress note on 12/28/2017  Family History:  Family History  Problem Relation Age of Onset  . Hyperlipidemia Father   . Cardiomyopathy Father        hypertrophic  . Irritable bowel syndrome Father   . Alcohol abuse Father   . Goiter Mother   . Mitral valve prolapse Sister   . Thyroid disease Unknown        maternal side  . Coronary artery disease Unknown        paternal side (coal miners)  . Crohn's disease Unknown        uncle and cousin  . Crohn's disease Paternal Uncle   .  Alcohol abuse Paternal Uncle   . Alcohol abuse Cousin   . Anxiety disorder Cousin   . Cancer Neg Hx     Social History: Reviewed social history from my progress note on 12/28/2017 Social History   Socioeconomic History  . Marital status: Married    Spouse name: tare  . Number of children: 2  . Years of education: Not on file  . Highest education level: Bachelor's degree (e.g., BA, AB, BS)  Occupational History    Comment: unemployed  Tobacco Use  . Smoking status: Former Smoker    Quit date: 07/03/2003    Years since quitting: 16.2  . Smokeless tobacco: Never Used  Substance and Sexual Activity  . Alcohol use: Not Currently    Comment: Occasional  . Drug use: No   . Sexual activity: Yes  Other Topics Concern  . Not on file  Social History Narrative  . Not on file   Social Determinants of Health   Financial Resource Strain:   . Difficulty of Paying Living Expenses:   Food Insecurity:   . Worried About Charity fundraiser in the Last Year:   . Arboriculturist in the Last Year:   Transportation Needs:   . Film/video editor (Medical):   Marland Kitchen Lack of Transportation (Non-Medical):   Physical Activity:   . Days of Exercise per Week:   . Minutes of Exercise per Session:   Stress:   . Feeling of Stress :   Social Connections:   . Frequency of Communication with Friends and Family:   . Frequency of Social Gatherings with Friends and Family:   . Attends Religious Services:   . Active Member of Clubs or Organizations:   . Attends Archivist Meetings:   Marland Kitchen Marital Status:     Allergies:  Allergies  Allergen Reactions  . Penicillin G Swelling  . Penicillins     REACTION: throat swelling    Metabolic Disorder Labs: No results found for: HGBA1C, MPG No results found for: PROLACTIN No results found for: CHOL, TRIG, HDL, CHOLHDL, VLDL, LDLCALC No results found for: TSH  Therapeutic Level Labs: No results found for: LITHIUM No results found for: VALPROATE No components found for:  CBMZ  Current Medications: Current Outpatient Medications  Medication Sig Dispense Refill  . fluticasone (FLONASE) 50 MCG/ACT nasal spray Place into the nose.    . hydrOXYzine (VISTARIL) 25 MG capsule Take 1-2 capsules (25-50 mg total) by mouth at bedtime as needed for anxiety (sleep). 60 capsule 1  . magnesium oxide (MAG-OX) 400 MG tablet Take 400 mg by mouth daily.    . Multiple Vitamin (MULTIVITAMIN) tablet Take 1 tablet by mouth daily.    . pantoprazole (PROTONIX) 40 MG tablet   2  . Saw Palmetto, Serenoa repens, (SAW PALMETTO PO) Take by mouth.    . sertraline (ZOLOFT) 50 MG tablet Take 1.5 tablets (75 mg total) by mouth daily. 135 tablet 1   . tamsulosin (FLOMAX) 0.4 MG CAPS capsule Take by mouth.     No current facility-administered medications for this visit.     Musculoskeletal: Strength & Muscle Tone: UTA Gait & Station: normal Patient leans: N/A  Psychiatric Specialty Exam: Review of Systems  Psychiatric/Behavioral: Negative for agitation, behavioral problems, confusion, decreased concentration, dysphoric mood, hallucinations, self-injury and sleep disturbance. The patient is not nervous/anxious and is not hyperactive.   All other systems reviewed and are negative.   There were no vitals taken for this visit.There  is no height or weight on file to calculate BMI.  General Appearance: Casual  Eye Contact:  Fair  Speech:  Clear and Coherent  Volume:  Normal  Mood:  Euthymic  Affect:  Congruent  Thought Process:  Goal Directed and Descriptions of Associations: Intact  Orientation:  Full (Time, Place, and Person)  Thought Content: Logical   Suicidal Thoughts:  No  Homicidal Thoughts:  No  Memory:  Immediate;   Fair Recent;   Fair Remote;   Fair  Judgement:  Fair  Insight:  Fair  Psychomotor Activity:  Normal  Concentration:  Concentration: Fair and Attention Span: Fair  Recall:  [employer] of Knowledge: Fair  Language: Fair  Akathisia:  No  Handed:  Right  AIMS (if indicated): UTA  Assets:  Communication Skills Desire for Improvement Housing Social Support  ADL's:  Intact  Cognition: WNL  Sleep:  Fair   Screenings: GAD-7     Video Visit from 10/12/2019 in [facility] Office Visit from 02/27/2019 in [facility]  Total GAD-7 Score  8  10    PHQ2-9     Office Visit from 02/27/2019 in [facility]  PHQ-2 Total Score  1       Assessment and Plan: Kawon is a 44 year old Caucasian male, employed, ex veteran, separated, lives in [place], has a history of insomnia, PTSD, GERD, anxiety was evaluated by telemedicine today.   Patient is biologically predisposed given his history of trauma being in [employer], history of mental health problems in his family.  Patient is currently stable on current medication regimen.  Plan as noted below.   PTSD-stable Zoloft as prescribed Melatonin 5 mg p.o. nightly at bedtime for sleep Continue psychotherapy sessions with Mr. Arlana Pouch as needed.  Anxiety disorder-stable Zoloft 75 mg p.o. daily GAD 7 equals 8  Follow-up in clinic in 4-5 months or sooner if needed.  I have spent atleast 20 minutes non face to face with patient today. More than 50 % of the time was spent for preparing to see the patient ( e.g., review of test, records ), ordering medications and test ,psychoeducation and supportive psychotherapy and care coordination,as well as documenting clinical information in electronic health record. This note was generated in part or whole with voice recognition software. Voice recognition is usually quite accurate but there are transcription errors that can and very often do occur. I apologize for any typographical errors that were not detected and corrected.         Ursula Alert, MD 10/12/2019, 9:14 AM

## 2019-12-27 IMAGING — CR DG CHEST 2V
2 series · 2 of 2 positions shown · non-contrast
Comparison: None.

CLINICAL DATA: Chest pain

EXAM:
CHEST - 2 VIEW

[chest pa]
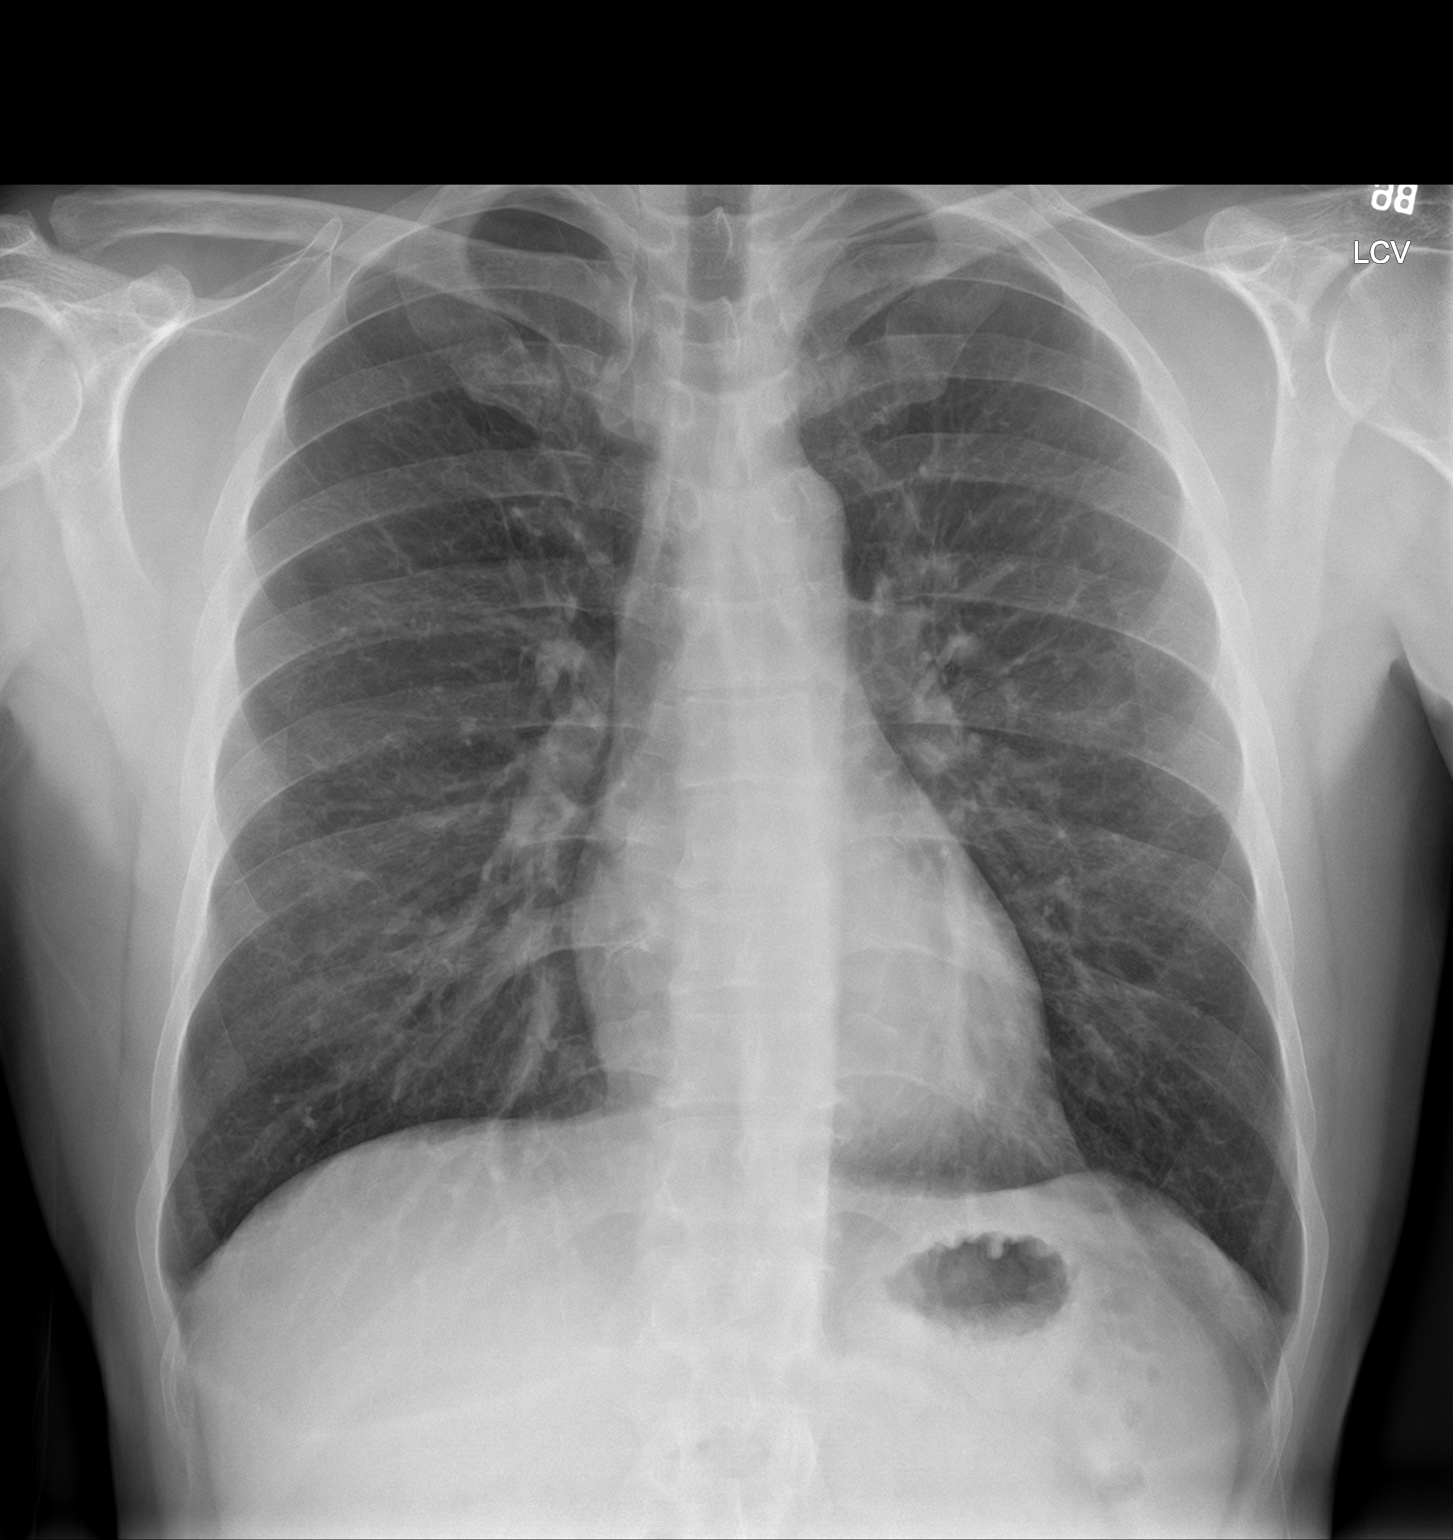

[chest lat]
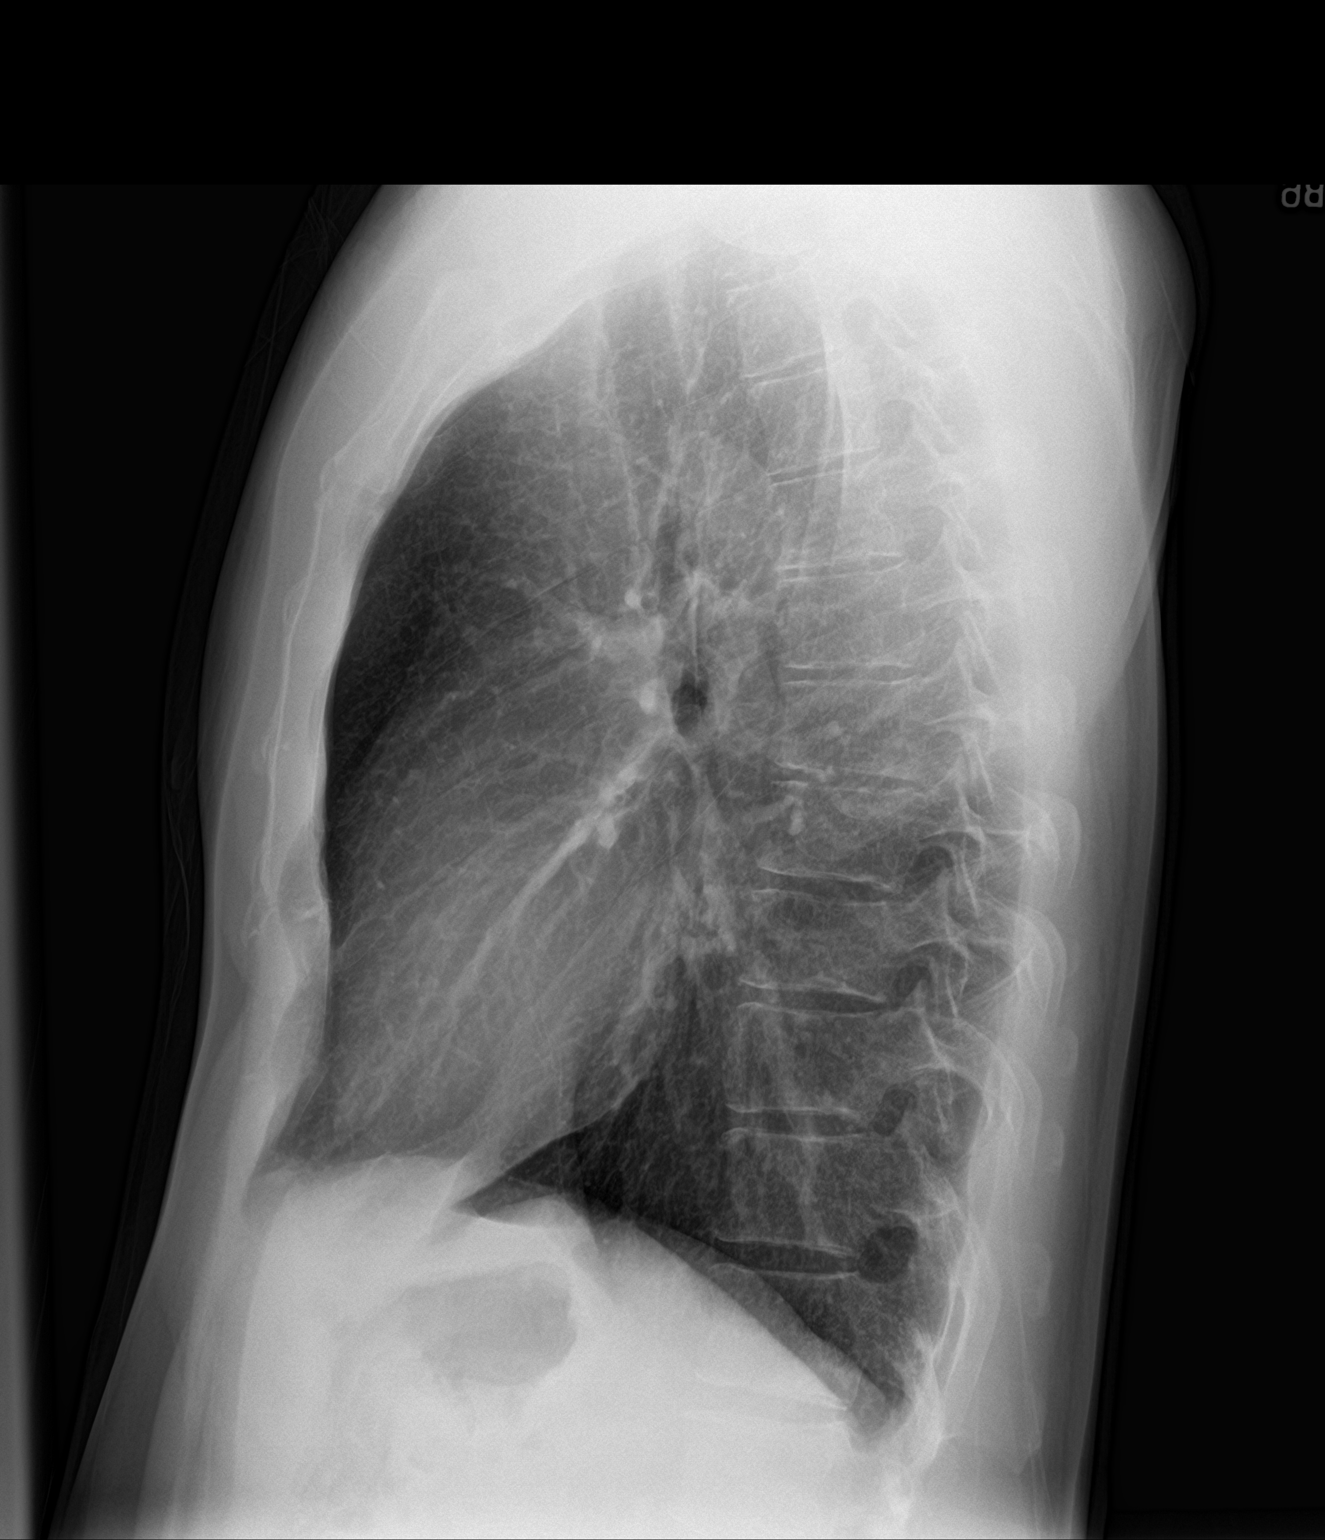

[2 of 2 positions shown; findings below may reference images not displayed]

FINDINGS: Lungs are clear.  No pleural effusion or pneumothorax.

The heart is normal in size.

Visualized osseous structures are within normal limits.
IMPRESSION: Normal chest radiographs.

## 2020-03-14 ENCOUNTER — Telehealth (INDEPENDENT_AMBULATORY_CARE_PROVIDER_SITE_OTHER): Payer: BC Managed Care – PPO | Admitting: Psychiatry

## 2020-03-14 ENCOUNTER — Encounter: Payer: Self-pay | Admitting: Psychiatry

## 2020-03-14 ENCOUNTER — Other Ambulatory Visit: Payer: Self-pay

## 2020-03-14 DIAGNOSIS — F410 Panic disorder [episodic paroxysmal anxiety] without agoraphobia: Secondary | ICD-10-CM | POA: Diagnosis not present

## 2020-03-14 DIAGNOSIS — F4310 Post-traumatic stress disorder, unspecified: Secondary | ICD-10-CM | POA: Diagnosis not present

## 2020-03-14 DIAGNOSIS — F411 Generalized anxiety disorder: Secondary | ICD-10-CM

## 2020-03-14 MED ORDER — SERTRALINE HCL 50 MG PO TABS: 75.0000 mg | ORAL_TABLET | Freq: Every day | ORAL | 1 refills | Status: DC

## 2020-03-14 NOTE — Progress Notes (Signed)
Provider Location : [facility] Patient Location : [facility]  Participants: Patient , Provider  Virtual Visit via Telephone Note  I connected with Shane [name] on 03/14/20 at  8:30 AM EDT by telephone and verified that I am speaking with the correct person using two identifiers.   I discussed the limitations, risks, security and privacy concerns of performing an evaluation and management service by telephone and the availability of in person appointments. I also discussed with the patient that there may be a patient responsible charge related to this service. The patient expressed understanding and agreed to proceed.   I discussed the assessment and treatment plan with the patient. The patient was provided an opportunity to ask questions and all were answered. The patient agreed with the plan and demonstrated an understanding of the instructions.   The patient was advised to call back or seek an in-person evaluation if the symptoms worsen or if the condition fails to improve as anticipated.     [facility] MD OP Progress Note  03/14/2020 1:04 PM Ova Meegan  MRN:  [id number]  Chief Complaint:  Chief Complaint    Follow-up     HPI: Shane [name] is a 44 year old Caucasian male, currently employed, lives in [place], separated, has a history of PTSD, GAD, insomnia was evaluated by phone today.  Patient today preferred to do a phone call.  Patient today reports he is currently stable on current medications.  He denies any significant anxiety or sadness.  He does report negative or weird dreams at night.  He reports it is usually about his ex wife.  He however reports he is not interested in a medication for that.  He is compliant on all medications.  Denies side effects.  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  Patient denies any suicidality, homicidality or perceptual disturbances.  Patient reports he has been noncompliant with  psychotherapy sessions, reports he felt he was making progress and also he got busy with work.  Visit Diagnosis:    ICD-10-CM   1. PTSD (post-traumatic stress disorder)  F43.10 sertraline (ZOLOFT) 50 MG tablet  2. Panic attack  F41.0   3. GAD (generalized anxiety disorder)  F41.1 sertraline (ZOLOFT) 50 MG tablet    Past Psychiatric History: I have reviewed past psychiatric history from my progress note on 12/28/2017.  Past trials of Zoloft  Past Medical History:  Past Medical History:  Diagnosis Date  . Allergic rhinitis   . Arthritis   . BPH (benign prostatic hyperplasia)    started on saw palmetto ([facility] Dr[facility] Yves Dill)  . Cervicalgia   . Environmental allergies   . Irritable bowel syndrome   . Pain in joint, lower leg   . Patellar tendinitis   . Polyp of nasal cavity   . Restless leg     Past Surgical History:  Procedure Laterality Date  . IRRIGATION AND DEBRIDEMENT SEBACEOUS CYST    . KNEE SURGERY Bilateral   . TONSILLECTOMY    . VASECTOMY  2009    Family Psychiatric History: I have reviewed family psychiatric history from my progress note on 12/28/2017  Family History:  Family History  Problem Relation Age of Onset  . Hyperlipidemia Father   . Cardiomyopathy Father        hypertrophic  . Irritable bowel syndrome Father   . Alcohol abuse Father   . Goiter Mother   . Mitral valve prolapse Sister   . Thyroid disease  Unknown        maternal side  . Coronary artery disease Unknown        paternal side (coal miners)  . Crohn's disease Unknown        uncle and cousin  . Crohn's disease Paternal Uncle   . Alcohol abuse Paternal Uncle   . Alcohol abuse Cousin   . Anxiety disorder Cousin   . Cancer Neg Hx     Social History: I have reviewed social history from my progress note on 12/28/2017 Social History   Socioeconomic History  . Marital status: Married    Spouse name: tare  . Number of children: 2  . Years of education: Not on file  . Highest education level:  Bachelor's degree (e.g., BA, AB, BS)  Occupational History    Comment: unemployed  Tobacco Use  . Smoking status: Former Smoker    Quit date: 07/03/2003    Years since quitting: 16.7  . Smokeless tobacco: Never Used  Vaping Use  . Vaping Use: Never used  Substance and Sexual Activity  . Alcohol use: Not Currently    Comment: Occasional  . Drug use: No  . Sexual activity: Yes  Other Topics Concern  . Not on file  Social History Narrative  . Not on file   Social Determinants of Health   Financial Resource Strain:   . Difficulty of Paying Living Expenses: Not on file  Food Insecurity:   . Worried About Charity fundraiser in the Last Year: Not on file  . Ran Out of Food in the Last Year: Not on file  Transportation Needs:   . Lack of Transportation (Medical): Not on file  . Lack of Transportation (Non-Medical): Not on file  Physical Activity:   . Days of Exercise per Week: Not on file  . Minutes of Exercise per Session: Not on file  Stress:   . Feeling of Stress : Not on file  Social Connections:   . Frequency of Communication with Friends and Family: Not on file  . Frequency of Social Gatherings with Friends and Family: Not on file  . Attends Religious Services: Not on file  . Active Member of Clubs or Organizations: Not on file  . Attends Archivist Meetings: Not on file  . Marital Status: Not on file    Allergies:  Allergies  Allergen Reactions  . Penicillin G Swelling  . Penicillins     REACTION: throat swelling    Metabolic Disorder Labs: No results found for: HGBA1C, MPG No results found for: PROLACTIN No results found for: CHOL, TRIG, HDL, CHOLHDL, VLDL, LDLCALC No results found for: TSH  Therapeutic Level Labs: No results found for: LITHIUM No results found for: VALPROATE No components found for:  CBMZ  Current Medications: Current Outpatient Medications  Medication Sig Dispense Refill  . omeprazole (PRILOSEC) 20 MG capsule Take by mouth.     . fluticasone (FLONASE) 50 MCG/ACT nasal spray Place into the nose.    . hydrOXYzine (VISTARIL) 25 MG capsule Take 1-2 capsules (25-50 mg total) by mouth at bedtime as needed for anxiety (sleep). 60 capsule 1  . magnesium oxide (MAG-OX) 400 MG tablet Take 400 mg by mouth daily.    . Melatonin 10 MG TABS Take by mouth.    . Multiple Vitamin (MULTIVITAMIN) tablet Take 1 tablet by mouth daily.    . pantoprazole (PROTONIX) 40 MG tablet   2  . Saw Palmetto, Serenoa repens, (SAW PALMETTO PO) Take by  mouth.    . sertraline (ZOLOFT) 50 MG tablet Take 1.5 tablets (75 mg total) by mouth daily. 135 tablet 1   No current facility-administered medications for this visit.     Musculoskeletal: Strength & Muscle Tone: UTA Gait & Station: UTA Patient leans: N/A  Psychiatric Specialty Exam: Review of Systems  Psychiatric/Behavioral: Positive for sleep disturbance.  All other systems reviewed and are negative.   There were no vitals taken for this visit.There is no height or weight on file to calculate BMI.  General Appearance: UTA  Eye Contact:  UTA  Speech:  Normal Rate  Volume:  Normal  Mood:  Euthymic  Affect:  UTA  Thought Process:  Goal Directed and Descriptions of Associations: Intact  Orientation:  Full (Time, Place, and Person)  Thought Content: Logical   Suicidal Thoughts:  No  Homicidal Thoughts:  No  Memory:  Immediate;   Fair Recent;   Fair Remote;   Fair  Judgement:  Fair  Insight:  Fair  Psychomotor Activity:  UTA  Concentration:  Concentration: Fair and Attention Span: Fair  Recall:  [employer] of Knowledge: Fair  Language: Fair  Akathisia:  No  Handed:  Right  AIMS (if indicated): UTA  Assets:  Communication Skills Desire for [place] Talents/Skills Transportation Vocational/Educational  ADL's:  Intact  Cognition: WNL  Sleep:  Restless - has negative dreams   Screenings: GAD-7     Video Visit from 10/12/2019 in [facility] Office Visit from 02/27/2019 in [facility]  Total GAD-7 Score 8 10    PHQ2-9     Office Visit from 02/27/2019 in [facility]  PHQ-2 Total Score 1       Assessment and Plan: Shane [name] is a 44 year old Caucasian male, employed, veteran, separated, lives in [place], has a history of insomnia, PTSD, GERD, anxiety was evaluated by telemedicine today.  Patient is biologically predisposed given his history of trauma, being in [employer], history of mental health problems in his family.  Patient is currently stable on medications except for sleep restlessness due to dreams.  Discussed plan as noted below.  Plan PTSD-stable Zoloft 75 mg p.o. daily Melatonin 10 mg p.o. nightly for sleep Patient does have dreams which are vivid at night, discussed treatment planning as well as possibly adding a medication like Minipress.  Patient is interested in dream planning.  He will also keep a journal.  He will let writer know if he is interested in medications.  Anxiety disorder-stable Zoloft 75 mg p.o. daily  Follow-up in clinic in 4 to 5 months or sooner if needed.  I have spent atleast 20 minutes non face to face with patient today. More than 50 % of the time was spent for preparing to see the patient ( e.g., review of test, records ),ordering medications and test ,psychoeducation and supportive psychotherapy and care coordination,as well as documenting clinical information in electronic health record. This note was generated in part or whole with voice recognition software. Voice recognition is usually quite accurate but there are transcription errors that can and very often do occur. I apologize for any typographical errors that were not detected and corrected.     Ursula Alert, MD 03/14/2020, 1:04 PM

## 2020-06-06 DIAGNOSIS — G4733 Obstructive sleep apnea (adult) (pediatric): Secondary | ICD-10-CM | POA: Insufficient documentation

## 2020-06-28 DIAGNOSIS — G4733 Obstructive sleep apnea (adult) (pediatric): Secondary | ICD-10-CM

## 2020-06-28 HISTORY — DX: Obstructive sleep apnea (adult) (pediatric): G47.33

## 2020-08-15 ENCOUNTER — Encounter: Payer: Self-pay | Admitting: Psychiatry

## 2020-08-15 ENCOUNTER — Telehealth (INDEPENDENT_AMBULATORY_CARE_PROVIDER_SITE_OTHER): Payer: BC Managed Care – PPO | Admitting: Psychiatry

## 2020-08-15 ENCOUNTER — Other Ambulatory Visit: Payer: Self-pay

## 2020-08-15 DIAGNOSIS — F410 Panic disorder [episodic paroxysmal anxiety] without agoraphobia: Secondary | ICD-10-CM | POA: Diagnosis not present

## 2020-08-15 DIAGNOSIS — F411 Generalized anxiety disorder: Secondary | ICD-10-CM

## 2020-08-15 DIAGNOSIS — F4310 Post-traumatic stress disorder, unspecified: Secondary | ICD-10-CM

## 2020-08-15 MED ORDER — SERTRALINE HCL 50 MG PO TABS: 75.0000 mg | ORAL_TABLET | Freq: Every day | ORAL | 1 refills | Status: DC

## 2020-08-15 NOTE — Progress Notes (Signed)
Virtual Visit via Telephone Note  I connected with Laural [name] on 08/15/20 at  8:30 AM EST by telephone and verified that I am speaking with the correct person using two identifiers. Location Provider Location : [facility] Patient Location : Work  Participants: Patient , Provider   I discussed the limitations, risks, security and privacy concerns of performing an evaluation and management service by telephone and the availability of in person appointments. I also discussed with the patient that there may be a patient responsible charge related to this service. The patient expressed understanding and agreed to proceed.   I discussed the assessment and treatment plan with the patient. The patient was provided an opportunity to ask questions and all were answered. The patient agreed with the plan and demonstrated an understanding of the instructions.   The patient was advised to call back or seek an in-person evaluation if the symptoms worsen or if the condition fails to improve as anticipated.   Shane [name] OP Progress Note  08/15/2020 8:50 AM Shane [name]  MRN:  [id number]  Chief Complaint:  Chief Complaint    Follow-up     HPI: Shane [name] is a 45 year old Caucasian male, currently employed, lives in [place], has a history of PTSD, GAD, insomnia was evaluated by telemedicine today.  Patient today reports he is doing well with regards to his anxiety and PTSD symptoms.  Patient reports he is better able to cope with his anxiety and is currently doing well on the Zoloft.  He is compliant on the medication.  Denies side effects.  He reports he continues to have vivid dreams at night however he is able to better cope with it.  He is not interested in medications.  Patient reports work is currently busy.  He however enjoys his work.  Patient denies any suicidality, homicidality or perceptual disturbances.  Patient reports he had a sleep study done recently and was diagnosed with obstructive sleep  apnea.  He is waiting for the appointment to get fitted with his CPAP mask.  Patient denies any other concerns today.    Visit Diagnosis:    ICD-10-CM   1. PTSD (post-traumatic stress disorder)  F43.10 sertraline (ZOLOFT) 50 MG tablet  2. Panic attack  F41.0   3. GAD (generalized anxiety disorder)  F41.1 sertraline (ZOLOFT) 50 MG tablet    Past Psychiatric History: I have reviewed past psychiatric history from my progress note on 12/28/2017.  Past trials of Zoloft  Past Medical History:  Past Medical History:  Diagnosis Date  . Allergic rhinitis   . Arthritis   . BPH (benign prostatic hyperplasia)    started on saw palmetto ([facility] Dr. Yves Dill)  . Cervicalgia   . Environmental allergies   . Irritable bowel syndrome   . Pain in joint, lower leg   . Patellar tendinitis   . Polyp of nasal cavity   . Restless leg     Past Surgical History:  Procedure Laterality Date  . IRRIGATION AND DEBRIDEMENT SEBACEOUS CYST    . KNEE SURGERY Bilateral   . TONSILLECTOMY    . VASECTOMY  2009    Family Psychiatric History: I have reviewed family psychiatric history from my progress note on 12/28/2017  Family History:  Family History  Problem Relation Age of Onset  . Hyperlipidemia Father   . Cardiomyopathy Father        hypertrophic  . Irritable bowel syndrome Father   . Alcohol abuse Father   . Goiter Mother   .  Mitral valve prolapse Sister   . Thyroid disease Unknown        maternal side  . Coronary artery disease Unknown        paternal side (coal miners)  . Crohn's disease Unknown        uncle and cousin  . Crohn's disease Paternal Uncle   . Alcohol abuse Paternal Uncle   . Alcohol abuse Cousin   . Anxiety disorder Cousin   . Cancer Neg Hx     Social History: I have reviewed social history from my progress note on 12/28/2017 Social History   Socioeconomic History  . Marital status: Married    Spouse name: tare  . Number of children: 2  . Years of education: Not on file  .  Highest education level: Bachelor's degree (e.g., BA, AB, BS)  Occupational History    Comment: unemployed  Tobacco Use  . Smoking status: Former Smoker    Quit date: 07/03/2003    Years since quitting: 17.1  . Smokeless tobacco: Never Used  Vaping Use  . Vaping Use: Never used  Substance and Sexual Activity  . Alcohol use: Not Currently    Comment: Occasional  . Drug use: No  . Sexual activity: Yes  Other Topics Concern  . Not on file  Social History Narrative  . Not on file   Social Determinants of Health   Financial Resource Strain: Not on file  Food Insecurity: Not on file  Transportation Needs: Not on file  Physical Activity: Not on file  Stress: Not on file  Social Connections: Not on file    Allergies:  Allergies  Allergen Reactions  . Penicillin G Swelling  . Penicillins     REACTION: throat swelling    Metabolic Disorder Labs: No results found for: HGBA1C, MPG No results found for: PROLACTIN No results found for: CHOL, TRIG, HDL, CHOLHDL, VLDL, LDLCALC No results found for: TSH  Therapeutic Level Labs: No results found for: LITHIUM No results found for: VALPROATE No components found for:  CBMZ  Current Medications: Current Outpatient Medications  Medication Sig Dispense Refill  . fluticasone (FLONASE) 50 MCG/ACT nasal spray Place into the nose.    . hydrOXYzine (VISTARIL) 25 MG capsule Take 1-2 capsules (25-50 mg total) by mouth at bedtime as needed for anxiety (sleep). 60 capsule 1  . magnesium oxide (MAG-OX) 400 MG tablet Take 400 mg by mouth daily.    . Melatonin 10 MG TABS Take by mouth.    . Multiple Vitamin (MULTIVITAMIN) tablet Take 1 tablet by mouth daily.    Marland Kitchen omeprazole (PRILOSEC) 20 MG capsule Take by mouth.    . pantoprazole (PROTONIX) 40 MG tablet   2  . Saw Palmetto, Serenoa repens, (SAW PALMETTO PO) Take by mouth.    . sertraline (ZOLOFT) 50 MG tablet Take 1.5 tablets (75 mg total) by mouth daily. 135 tablet 1  . tamsulosin (FLOMAX)  0.4 MG CAPS capsule Take 0.8 mg by mouth daily.     No current facility-administered medications for this visit.     Musculoskeletal: Strength & Muscle Tone: UTA Gait & Station: UTA Patient leans: N/A  Psychiatric Specialty Exam: Review of Systems  Psychiatric/Behavioral: Negative for agitation, behavioral problems, confusion, decreased concentration, dysphoric mood, hallucinations, self-injury, sleep disturbance and suicidal ideas. The patient is not nervous/anxious and is not hyperactive.   All other systems reviewed and are negative.   There were no vitals taken for this visit.There is no height or weight on file  to calculate BMI.  General Appearance: UTA  Eye Contact:  UTA  Speech:  Clear and Coherent  Volume:  Normal  Mood:  Euthymic  Affect:  UTA  Thought Process:  Goal Directed and Descriptions of Associations: Intact  Orientation:  Full (Time, Place, and Person)  Thought Content: Logical   Suicidal Thoughts:  No  Homicidal Thoughts:  No  Memory:  Immediate;   Fair Recent;   Fair Remote;   Fair  Judgement:  Fair  Insight:  Fair  Psychomotor Activity:  UTA  Concentration:  Concentration: Fair and Attention Span: Fair  Recall:  [employer] of Knowledge: Fair  Language: Fair  Akathisia:  No  Handed:  Right  AIMS (if indicated): UTA  Assets:  Communication Skills Desire for Improvement Housing Social Support  ADL's:  Intact  Cognition: WNL  Sleep:  Fair   Screenings: GAD-7   Flowsheet Row Video Visit from 10/12/2019 in [place] Office Visit from 02/27/2019 in [place]  Total GAD-7 Score 8 10    PHQ2-9   [place] Video Visit from 08/15/2020 in [place] Office Visit from 02/27/2019 in [place]  PHQ-2 Total Score 0 1    Flowsheet Row Video Visit from 08/15/2020 in [place] No Risk        Assessment and Plan: Shane [name] is a 45 year old Caucasian male, employed, veteran, separated, lives in [place], has a history of insomnia, PTSD, GERD, anxiety was evaluated by telemedicine today.  Patient is biologically predisposed given his history of trauma, being in [employer], history of mental health problems in his family.  Patient is currently stable on current medication regimen.  Plan as noted below.  Plan PTSD-stable Zoloft 75 mg p.o. daily Melatonin 10 mg p.o. nightly for sleep   Anxiety disorder-stable Zoloft 75 mg p.o. daily  Patient with recent diagnosis of OSA awaiting CPAP device.  Follow-up in clinic in 4 to 5 months or sooner if needed.  Patient advised to come into the office for in person visit.   I have spent atleast 18 minutes non face to face with patient today. More than 50 % of the time was spent for preparing to see the patient ( e.g., review of test, records ),  ordering medications and test ,psychoeducation and supportive psychotherapy and care coordination,as well as documenting clinical information in electronic health record. This note was generated in part or whole with voice recognition software. Voice recognition is usually quite accurate but there are transcription errors that can and very often do occur. I apologize for any typographical errors that were not detected and corrected.       Ursula Alert, [name] 08/15/2020, 8:50 AM

## 2020-10-14 ENCOUNTER — Other Ambulatory Visit: Payer: Self-pay | Admitting: Psychiatry

## 2020-10-14 DIAGNOSIS — F4310 Post-traumatic stress disorder, unspecified: Secondary | ICD-10-CM

## 2020-10-14 DIAGNOSIS — F411 Generalized anxiety disorder: Secondary | ICD-10-CM

## 2020-10-15 ENCOUNTER — Telehealth: Payer: Self-pay

## 2020-10-15 DIAGNOSIS — F411 Generalized anxiety disorder: Secondary | ICD-10-CM

## 2020-10-15 DIAGNOSIS — F4310 Post-traumatic stress disorder, unspecified: Secondary | ICD-10-CM

## 2020-10-15 MED ORDER — SERTRALINE HCL 50 MG PO TABS: 75.0000 mg | ORAL_TABLET | Freq: Every day | ORAL | 0 refills | Status: DC

## 2020-10-15 NOTE — Telephone Encounter (Signed)
I have sent sertraline to pharmacy at [facility].

## 2020-10-15 NOTE — Telephone Encounter (Signed)
[facility] [place]. rx sent to wrong pharmacy

## 2020-10-15 NOTE — Telephone Encounter (Signed)
pt called again to get a [facility] [place] .  it was sent to the wrong pharmacy

## 2020-12-10 ENCOUNTER — Other Ambulatory Visit: Payer: Self-pay

## 2020-12-10 ENCOUNTER — Telehealth (INDEPENDENT_AMBULATORY_CARE_PROVIDER_SITE_OTHER): Payer: BC Managed Care – PPO | Admitting: Psychiatry

## 2020-12-10 ENCOUNTER — Encounter: Payer: Self-pay | Admitting: Psychiatry

## 2020-12-10 DIAGNOSIS — G4701 Insomnia due to medical condition: Secondary | ICD-10-CM

## 2020-12-10 DIAGNOSIS — F4310 Post-traumatic stress disorder, unspecified: Secondary | ICD-10-CM | POA: Diagnosis not present

## 2020-12-10 DIAGNOSIS — F410 Panic disorder [episodic paroxysmal anxiety] without agoraphobia: Secondary | ICD-10-CM

## 2020-12-10 DIAGNOSIS — F411 Generalized anxiety disorder: Secondary | ICD-10-CM | POA: Diagnosis not present

## 2020-12-10 MED ORDER — SERTRALINE HCL 50 MG PO TABS: 50.0000 mg | ORAL_TABLET | Freq: Every day | ORAL | 0 refills | Status: DC

## 2020-12-10 NOTE — Progress Notes (Signed)
Virtual Visit via Video Note  I connected with Shane [name] on 12/10/20 at 10:00 AM EDT by a video enabled telemedicine application and verified that I am speaking with the correct person using two identifiers.  Location Provider Location : [facility] Patient Location : Work  Participants: Patient , Provider   I discussed the limitations of evaluation and management by telemedicine and the availability of in person appointments. The patient expressed understanding and agreed to proceed.  I discussed the assessment and treatment plan with the patient. The patient was provided an opportunity to ask questions and all were answered. The patient agreed with the plan and demonstrated an understanding of the instructions.   The patient was advised to call back or seek an in-person evaluation if the symptoms worsen or if the condition fails to improve as anticipated.                                                                                Toad Hop MD OP Progress Note  12/10/2020 10:51 AM Shane [name]  MRN:  [id number]  Chief Complaint:  Chief Complaint   Follow-up; Anxiety    HPI: Shane [name] is a 45 year old Caucasian male, currently employed, lives in [place], has a history of PTSD, GAD, insomnia was evaluated by telemedicine today.  Patient today apologized for not being able to come into the office for in office visit.  Patient today reports overall he is doing okay.  He is currently teaching summer school.  He had stays busy.  Denies any significant sadness or crying spells.  Denies any significant anxiety symptoms.  He reports sleep continues to be restless.  He does feel tired during the day.  He had sleep study done however has not been able to get a CPAP device yet.  He reports it is kind of expensive and has tried to use a mouthguard instead however that did not work well.  He is planning to get a CPAP device soon.  He is not interested in a sleep medication at this time.  He uses  melatonin 15 mg which helps to some extent.  Patient denies any suicidality, homicidality or perceptual disturbances.  Patient denies any other concerns today.  Visit Diagnosis:    ICD-10-CM   1. PTSD (post-traumatic stress disorder)  F43.10 sertraline (ZOLOFT) 50 MG tablet    2. Panic attack  F41.0     3. 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  Past trials of Zoloft.  Past Medical History:  Past Medical History:  Diagnosis Date   Allergic rhinitis    Arthritis    BPH (benign prostatic hyperplasia)    started on saw palmetto ([facility] Dr. Yves Dill)   Cervicalgia    Environmental allergies    Irritable bowel syndrome    OSA (obstructive sleep apnea) 2022   Pain in joint, lower leg    Patellar tendinitis    Polyp of nasal cavity    Restless leg  Past Surgical History:  Procedure Laterality Date   IRRIGATION AND DEBRIDEMENT SEBACEOUS CYST     KNEE SURGERY Bilateral    TONSILLECTOMY     VASECTOMY  2009    Family Psychiatric History: I have reviewed family psychiatric history from progress note on 12/28/2017  Family History:  Family History  Problem Relation Age of Onset   Hyperlipidemia Father    Cardiomyopathy Father        hypertrophic   Irritable bowel syndrome Father    Alcohol abuse Father    Goiter Mother    Mitral valve prolapse Sister    Thyroid disease Other        maternal side   Coronary artery disease Other        paternal side (coal miners)   Crohn's disease Other        uncle and cousin   Crohn's disease Paternal Uncle    Alcohol abuse Paternal Uncle    Alcohol abuse Cousin    Anxiety disorder Cousin    Cancer Neg Hx     Social History: Reviewed social history from progress note on 12/28/2017 Social History   Socioeconomic History    Marital status: Legally Separated    Spouse name: Shane [name]   Number of children: 2   Years of education: Not on file   Highest education level: Bachelor's degree (e.g., BA, AB, BS)  Occupational History   Occupation: EMPLOYED as special ed teacher  Tobacco Use   Smoking status: Former    Pack years: 0.00    Types: Cigarettes    Quit date: 07/03/2003    Years since quitting: 17.4   Smokeless tobacco: Never  Vaping Use   Vaping Use: Never used  Substance and Sexual Activity   Alcohol use: Not Currently    Comment: Occasional   Drug use: No   Sexual activity: Yes  Other Topics Concern   Not on file  Social History Narrative   Not on file   Social Determinants of Health   Financial Resource Strain: Not on file  Food Insecurity: Not on file  Transportation Needs: Not on file  Physical Activity: Not on file  Stress: Not on file  Social Connections: Not on file    Allergies:  Allergies  Allergen Reactions   Penicillin G Swelling   Penicillins     REACTION: throat swelling    Metabolic Disorder Labs: No results found for: HGBA1C, MPG No results found for: PROLACTIN No results found for: CHOL, TRIG, HDL, CHOLHDL, VLDL, LDLCALC No results found for: TSH  Therapeutic Level Labs: No results found for: LITHIUM No results found for: VALPROATE No components found for:  CBMZ  Current Medications: Current Outpatient Medications  Medication Sig Dispense Refill   fluticasone (FLONASE) 50 MCG/ACT nasal spray SPRAY 2 SPRAYS INTO EACH NOSTRIL EVERY DAY     fluticasone (FLONASE) 50 MCG/ACT nasal spray Place into the nose.     hydrOXYzine (VISTARIL) 25 MG capsule Take 1-2 capsules (25-50 mg total) by mouth at bedtime as needed for anxiety (sleep). 60 capsule 1   magnesium oxide (MAG-OX) 400 MG tablet Take 400 mg by mouth daily.     Melatonin 10 MG TABS Take by mouth.     Multiple Vitamin (MULTIVITAMIN) tablet Take 1 tablet by mouth daily.     omeprazole (PRILOSEC) 20 MG capsule  Take by mouth.     pantoprazole (PROTONIX) 40 MG tablet   2   Saw Palmetto, Serenoa repens, (SAW PALMETTO PO) Take  by mouth.     sertraline (ZOLOFT) 50 MG tablet Take 1 tablet (50 mg total) by mouth daily. 90 tablet 0   tamsulosin (FLOMAX) 0.4 MG CAPS capsule Take 0.8 mg by mouth daily.     No current facility-administered medications for this visit.     Musculoskeletal: Strength & Muscle Tone:  UTA Gait & Station:  UTA Patient leans: N/A  Psychiatric Specialty Exam: Review of Systems  Psychiatric/Behavioral:  Positive for sleep disturbance.   All other systems reviewed and are negative.  There were no vitals taken for this visit.There is no height or weight on file to calculate BMI.  General Appearance: Fairly Groomed  Eye Contact:  Good  Speech:  Clear and Coherent  Volume:  Normal  Mood:  Euthymic  Affect:  Full Range  Thought Process:  Goal Directed and Descriptions of Associations: Intact  Orientation:  Full (Time, Place, and Person)  Thought Content: Logical   Suicidal Thoughts:  No  Homicidal Thoughts:  No  Memory:  Immediate;   Fair Recent;   Fair Remote;   Fair  Judgement:  Intact  Insight:  Good  Psychomotor Activity:  Normal  Concentration:  Concentration: Fair and Attention Span: Fair  Recall:  [employer] of Knowledge: Fair  Language: Fair  Akathisia:  No  Handed:  Right  AIMS (if indicated): not done  Assets:  Communication Skills Desire for Improvement Social Support Talents/Skills Transportation Vocational/Educational  ADL's:  Intact  Cognition: WNL  Sleep:  Poor   Screenings: GAD-7    Flowsheet Row Video Visit from 12/10/2020 in [place] Video Visit from 10/12/2019 in [facility] Office Visit from 02/27/2019 in [place]  Total GAD-7 Score 4 8 10       PHQ2-9    Flowsheet Row Video Visit from 12/10/2020 in [place] Video  Visit from 08/15/2020 in [place] Visit from 02/27/2019 in [facility]  PHQ-2 Total Score 0 0 1      Flowsheet Row Video Visit from 12/10/2020 in [place] Video Visit from 08/15/2020 in [place] No Risk No Risk        Assessment and Plan: Ahamed Hofland is a 45 year old Caucasian male, employed, veteran, divorced, lives in [place] has a history of PTSD, sleep problems, anxiety, GERD was evaluated by telemedicine today.  Patient is currently struggling with sleep problems, noncompliant with CPAP, otherwise doing well with regards to his mood.  Plan as noted below.  Plan PTSD-stable Patient is interested in tapering off of the Zoloft. Discussed patient to reduce the Zoloft to 50 mg p.o. daily for the next 4 weeks. If he does well on the 50 mg and he could reduce it to 25 mg and then stay on it until his next appointment. He does not need a prescription sent to the pharmacy yet.  Patient advised to call us back as needed when he is due for his medication refill.  Anxiety disorder-stable Zoloft as prescribed  Insomnia-unstable Patient with diagnosis of OSA currently noncompliant with CPAP.  Provided education.  Encouraged compliance. Continue melatonin 15 mg p.o. nightly for sleep Patient is not interested in another sleep aid at this time  Follow-up in clinic in 8 weeks or sooner if needed.  This note was generated in part or whole with voice recognition software. Voice recognition is usually quite accurate but there are transcription errors  that can and very often do occur. I apologize for any typographical errors that were not detected and corrected.       Ursula Alert, MD 12/10/2020, 10:51 AM

## 2020-12-18 ENCOUNTER — Telehealth: Payer: Self-pay

## 2020-12-18 DIAGNOSIS — F410 Panic disorder [episodic paroxysmal anxiety] without agoraphobia: Secondary | ICD-10-CM

## 2020-12-18 DIAGNOSIS — G4700 Insomnia, unspecified: Secondary | ICD-10-CM

## 2020-12-18 DIAGNOSIS — F4310 Post-traumatic stress disorder, unspecified: Secondary | ICD-10-CM

## 2020-12-18 MED ORDER — ZOLPIDEM TARTRATE 5 MG PO TABS: 2.5000 mg | ORAL_TABLET | Freq: Every evening | ORAL | 1 refills | Status: DC | PRN

## 2020-12-18 NOTE — Telephone Encounter (Signed)
pt called states that on last visit you and him talked about adding ambien.  He would like to try that if you can send to the pharmacy

## 2020-12-18 NOTE — Telephone Encounter (Signed)
Returned call to patient.  Discussed zolpidem.  Provided medication education.  Discussed side effects including sleep complex behaviors.

## 2021-01-10 ENCOUNTER — Other Ambulatory Visit: Payer: Self-pay | Admitting: Psychiatry

## 2021-01-10 DIAGNOSIS — F4310 Post-traumatic stress disorder, unspecified: Secondary | ICD-10-CM

## 2021-01-10 DIAGNOSIS — F411 Generalized anxiety disorder: Secondary | ICD-10-CM

## 2021-02-09 ENCOUNTER — Telehealth: Payer: BC Managed Care – PPO | Admitting: Psychiatry

## 2021-02-16 ENCOUNTER — Telehealth (INDEPENDENT_AMBULATORY_CARE_PROVIDER_SITE_OTHER): Payer: BC Managed Care – PPO | Admitting: Psychiatry

## 2021-02-16 ENCOUNTER — Other Ambulatory Visit: Payer: Self-pay

## 2021-02-16 ENCOUNTER — Encounter: Payer: Self-pay | Admitting: Psychiatry

## 2021-02-16 DIAGNOSIS — F411 Generalized anxiety disorder: Secondary | ICD-10-CM

## 2021-02-16 DIAGNOSIS — G4701 Insomnia due to medical condition: Secondary | ICD-10-CM

## 2021-02-16 DIAGNOSIS — F4310 Post-traumatic stress disorder, unspecified: Secondary | ICD-10-CM

## 2021-02-16 MED ORDER — SERTRALINE HCL 25 MG PO TABS: 25.0000 mg | ORAL_TABLET | Freq: Every day | ORAL | 1 refills | Status: AC

## 2021-02-16 MED ORDER — ZOLPIDEM TARTRATE 5 MG PO TABS: 2.5000 mg | ORAL_TABLET | Freq: Every evening | ORAL | 3 refills | Status: AC | PRN

## 2021-02-16 NOTE — Progress Notes (Signed)
Virtual Visit via Video Note  I connected with Shane [name] on 02/16/21 at 10:30 AM EDT by a video enabled telemedicine application and verified that I am speaking with the correct person using two identifiers.  Location Provider Location : [facility] Patient Location : Work  Participants: Patient , Provider    I discussed the limitations of evaluation and management by telemedicine and the availability of in person appointments. The patient expressed understanding and agreed to proceed.   I discussed the assessment and treatment plan with the patient. The patient was provided an opportunity to ask questions and all were answered. The patient agreed with the plan and demonstrated an understanding of the instructions.   The patient was advised to call back or seek an in-person evaluation if the symptoms worsen or if the condition fails to improve as anticipated.   [facility] MD OP Progress Note  02/16/2021 12:53 PM Shane [name]  MRN:  [id number]  Chief Complaint:  Chief Complaint   Follow-up; Anxiety; Depression; Insomnia    HPI: Shane [name] is a 45 year old Caucasian male, currently employed, lives in [place], has a history of PTSD, GAD, insomnia was evaluated by telemedicine today.  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  He reports his anxiety and PTSD symptoms continues to be stable on the current medication regimen.  He was able to reduce the sertraline to 50 mg and is doing well with the lower dose.  He did not reduce it to 25 mg as discussed last visit.  However he is agreeable to do so today.  He continues to struggle with sleep on and off.  He is trying to limit the use of Ambien however when he uses the Ambien he is able to sleep better.  He does have a history of obstructive sleep apnea, noncompliant with CPAP.  He reports he has not been able to get a CPAP device yet and is still waiting.  He reports he was told it is backed up for several  months.  He however plans to follow up with ENT for a procedure to be evaluated for causes of snoring and sleep apnea which could be corrected which is coming up next month.  Patient denies any suicidality, homicidality or perceptual disturbances.  Patient denies any other concerns today.  Visit Diagnosis:    ICD-10-CM   1. PTSD (post-traumatic stress disorder)  F43.10 sertraline (ZOLOFT) 25 MG tablet    2. GAD (generalized anxiety disorder)  F41.1 sertraline (ZOLOFT) 25 MG tablet    3. Insomnia due to medical condition  G47.01 zolpidem (AMBIEN) 5 MG tablet   osa      Past Psychiatric History: Reviewed past psychiatric history from progress note on 12/28/2017.  Past trials of Zoloft  Past Medical History:  Past Medical History:  Diagnosis Date   Allergic rhinitis    Arthritis    BPH (benign prostatic hyperplasia)    started on saw palmetto (Uro Dr. Yves Dill)   Cervicalgia    Environmental allergies    Irritable bowel syndrome    OSA (obstructive sleep apnea) 2022   Pain in joint, lower leg    Patellar tendinitis    Polyp of nasal cavity    Restless leg     Past Surgical History:  Procedure Laterality Date   IRRIGATION AND DEBRIDEMENT SEBACEOUS CYST     KNEE SURGERY Bilateral    TONSILLECTOMY     VASECTOMY  2009    Family  Psychiatric History: Reviewed family psychiatric history from progress note on 12/28/2017.  Family History:  Family History  Problem Relation Age of Onset   Hyperlipidemia Father    Cardiomyopathy Father        hypertrophic   Irritable bowel syndrome Father    Alcohol abuse Father    Goiter Mother    Mitral valve prolapse Sister    Thyroid disease Other        maternal side   Coronary artery disease Other        paternal side (coal miners)   Crohn's disease Other        uncle and cousin   Crohn's disease Paternal Uncle    Alcohol abuse Paternal Uncle    Alcohol abuse Cousin    Anxiety disorder Cousin    Cancer Neg Hx     Social History:  Reviewed social history from progress note on 12/28/2017. Social History   Socioeconomic History   Marital status: Legally Separated    Spouse name: tare   Number of children: 2   Years of education: Not on file   Highest education level: Bachelor's degree (e.g., BA, AB, BS)  Occupational History   Occupation: EMPLOYED as special ed teacher  Tobacco Use   Smoking status: Former    Types: Cigarettes    Quit date: 07/03/2003    Years since quitting: 17.6   Smokeless tobacco: Never  Vaping Use   Vaping Use: Never used  Substance and Sexual Activity   Alcohol use: Not Currently    Comment: Occasional   Drug use: No   Sexual activity: Yes  Other Topics Concern   Not on file  Social History Narrative   Not on file   Social Determinants of Health   Financial Resource Strain: Not on file  Food Insecurity: Not on file  Transportation Needs: Not on file  Physical Activity: Not on file  Stress: Not on file  Social Connections: Not on file    Allergies:  Allergies  Allergen Reactions   Penicillin G Swelling   Penicillins     REACTION: throat swelling    Metabolic Disorder Labs: No results found for: HGBA1C, MPG No results found for: PROLACTIN No results found for: CHOL, TRIG, HDL, CHOLHDL, VLDL, LDLCALC No results found for: TSH  Therapeutic Level Labs: No results found for: LITHIUM No results found for: VALPROATE No components found for:  CBMZ  Current Medications: Current Outpatient Medications  Medication Sig Dispense Refill   finasteride (PROSCAR) 5 MG tablet Take by mouth.     omeprazole (PRILOSEC) 20 MG capsule Take 1 tablet by mouth daily.     sertraline (ZOLOFT) 25 MG tablet Take 1 tablet (25 mg total) by mouth daily. 90 tablet 1   BINAXNOW COVID-19 AG HOME TEST KIT      fluticasone (FLONASE) 50 MCG/ACT nasal spray Place into the nose.     fluticasone (FLONASE) 50 MCG/ACT nasal spray SPRAY 2 SPRAYS INTO EACH NOSTRIL EVERY DAY     hydrOXYzine (VISTARIL) 25 MG  capsule Take 1-2 capsules (25-50 mg total) by mouth at bedtime as needed for anxiety (sleep). 60 capsule 1   magnesium oxide (MAG-OX) 400 MG tablet Take 400 mg by mouth daily.     Melatonin 10 MG TABS Take by mouth.     Multiple Vitamin (MULTIVITAMIN) tablet Take 1 tablet by mouth daily.     omeprazole (PRILOSEC) 20 MG capsule Take by mouth.     pantoprazole (PROTONIX) 40 MG tablet  2   Saw Palmetto, Serenoa repens, (SAW PALMETTO PO) Take by mouth.     tamsulosin (FLOMAX) 0.4 MG CAPS capsule Take 0.8 mg by mouth daily.     zolpidem (AMBIEN) 5 MG tablet Take 0.5-1 tablets (2.5-5 mg total) by mouth at bedtime as needed for sleep. 15 tablet 3   No current facility-administered medications for this visit.     Musculoskeletal: Strength & Muscle Tone:  UTA Gait & Station: normal Patient leans: N/A  Psychiatric Specialty Exam: Review of Systems  Psychiatric/Behavioral:  Positive for sleep disturbance.   All other systems reviewed and are negative.  There were no vitals taken for this visit.There is no height or weight on file to calculate BMI.  General Appearance: Casual  Eye Contact:  Fair  Speech:  Clear and Coherent  Volume:  Normal  Mood:  Euthymic  Affect:  Congruent  Thought Process:  Goal Directed and Descriptions of Associations: Intact  Orientation:  Full (Time, Place, and Person)  Thought Content: Logical   Suicidal Thoughts:  No  Homicidal Thoughts:  No  Memory:  Immediate;   Fair Recent;   Fair Remote;   Fair  Judgement:  Fair  Insight:  Fair  Psychomotor Activity:  Normal  Concentration:  Concentration: Fair and Attention Span: Fair  Recall:  [employer] of Knowledge: Fair  Language: Fair  Akathisia:  No  Handed:  Right  AIMS (if indicated): not done  Assets:  Communication Skills Desire for [place] Talents/Skills Transportation Vocational/Educational  ADL's:  Intact  Cognition: WNL  Sleep:   Restless   Screenings: GAD-7     Flowsheet Row Video Visit from 02/16/2021 in [facility] Video Visit from 12/10/2020 in [facility] Video Visit from 10/12/2019 in [facility] Office Visit from 02/27/2019 in [facility]  Total GAD-7 Score 1 4 8 10       PHQ2-9    [place] Video Visit from 12/10/2020 in [facility] Video Visit from 08/15/2020 in [facility] Visit from 02/27/2019 in [facility]  PHQ-2 Total Score 0 0 1      Flowsheet Row Video Visit from 12/10/2020 in [facility] Video Visit from 08/15/2020 in [facility] No Risk No Risk        Assessment and Plan: Kelcey Wickstrom is a 45 year old Caucasian male, employed, veteran, divorced, lives in [place], has a history of PTSD, sleep problems, anxiety, GERD was evaluated by telemedicine today.  Patient is currently tolerating the lower dosage of Zoloft and is interested in tapering it down further.  Patient continues to have sleep problems, has a history of OSA, noncompliant with CPAP currently awaiting a procedure.  Patient will benefit from the following plan.  Plan PTSD-stable We will reduce Zoloft to 25 mg p.o. daily   Anxiety disorder-stable Reduce Zoloft to 25 mg p.o. daily  Insomnia-unstable Continue Ambien 2.5-5 mg p.o. nightly as needed.  Patient is limiting use Reviewed [place] PMP aware. Melatonin 15 mg manage nightly for sleep Patient with diagnosis of OSA, awaiting CPAP.  Patient is scheduled for drug-induced sleep endoscopy with dynamic evaluation of velum, pharynx, tongue base and larynx for evaluation of sleep-disordered breathing.  Follow-up in clinic in 3 months or sooner if needed.  This note was generated in part or whole with voice recognition software. Voice  recognition is usually quite accurate but there  are transcription errors that can and very often do occur. I apologize for any typographical errors that were not detected and corrected.        Ursula Alert, MD 02/16/2021, 12:53 PM

## 2021-03-17 ENCOUNTER — Ambulatory Visit
Admission: EM | Admit: 2021-03-17 | Discharge: 2021-03-17 | Disposition: A | Discharge status: Home / Self Care | Payer: BC Managed Care – PPO | Attending: Emergency Medicine | Admitting: Emergency Medicine

## 2021-03-17 ENCOUNTER — Other Ambulatory Visit: Payer: Self-pay

## 2021-03-17 ENCOUNTER — Encounter: Payer: Self-pay | Admitting: Emergency Medicine

## 2021-03-17 DIAGNOSIS — B349 Viral infection, unspecified: Secondary | ICD-10-CM

## 2021-03-17 MED ORDER — BENZONATATE 100 MG PO CAPS: 100.0000 mg | ORAL_CAPSULE | Freq: Three times a day (TID) | ORAL | 0 refills | Status: AC | PRN
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
## 2021-03-17 NOTE — ED Triage Notes (Signed)
Pt c/o of productive cough, ST, HA and congestion x 2 day. OTC medications not helping with SXS at home Covid test was negative.

## 2021-03-17 NOTE — Discharge Instructions (Addendum)
Your COVID and Flu tests are pending.  You should self quarantine until the test results are back.    Take the [facility] as needed for cough.  Take Tylenol or ibuprofen as needed for fever or discomfort.  Take plain Mucinex as needed for congestion.  Rest and keep yourself hydrated.    Follow-up with your primary care provider if your symptoms are not improving.

## 2021-03-18 LAB — COVID-19, FLU A+B NAA
Influenza A, NAA: NOT DETECTED
Influenza B, NAA: NOT DETECTED
SARS-CoV-2, NAA: DETECTED — AB

## 2021-05-27 ENCOUNTER — Encounter: Payer: Self-pay | Admitting: Psychiatry

## 2021-05-27 ENCOUNTER — Other Ambulatory Visit: Payer: Self-pay

## 2021-05-27 ENCOUNTER — Telehealth (INDEPENDENT_AMBULATORY_CARE_PROVIDER_SITE_OTHER): Payer: BC Managed Care – PPO | Admitting: Psychiatry

## 2021-05-27 DIAGNOSIS — F411 Generalized anxiety disorder: Secondary | ICD-10-CM

## 2021-05-27 DIAGNOSIS — F4310 Post-traumatic stress disorder, unspecified: Secondary | ICD-10-CM | POA: Diagnosis not present

## 2021-05-27 DIAGNOSIS — G4733 Obstructive sleep apnea (adult) (pediatric): Secondary | ICD-10-CM

## 2021-05-27 NOTE — Progress Notes (Signed)
Virtual Visit via Video Note  I connected with Laural [name] on 05/27/21 at  3:40 PM EST by a video enabled telemedicine application and verified that I am speaking with the correct person using two identifiers.  Location Provider Location : [facility] Patient Location : Work  Participants: Patient , Provider    I discussed the limitations of evaluation and management by telemedicine and the availability of in person appointments. The patient expressed understanding and agreed to proceed.    I discussed the assessment and treatment plan with the patient. The patient was provided an opportunity to ask questions and all were answered. The patient agreed with the plan and demonstrated an understanding of the instructions.   The patient was advised to call back or seek an in-person evaluation if the symptoms worsen or if the condition fails to improve as anticipated.   [facility] MD OP Progress Note  05/27/2021 3:51 PM Shane [name]  MRN:  [id number]  Chief Complaint:  Chief Complaint   Follow-up; Anxiety    HPI: Shane [name] is a 45 year old Caucasian male, currently employed, lives in [place], has a history of PTSD, GAD, insomnia was evaluated by telemedicine today.  Patient reports he had a good Thanksgiving break.  He was able to spend some time with this children.  Patient reports overall his mood symptoms are stable.  He continues to do well on the reduced dosage of sertraline.  Denies side effects.  Patient reports sleep as restless.  He was diagnosed with obstructive sleep apnea and is currently trying to get used to CPAP.  Patient also reports he had an evaluation for possible surgical options.  Patient denies any suicidality, homicidality or perceptual disturbances.  Patient denies any other concerns today.  Visit Diagnosis:    ICD-10-CM   1. PTSD (post-traumatic stress disorder)  F43.10     2. GAD (generalized anxiety disorder)  F41.1     3. OSA (obstructive sleep apnea)  G47.33        Past Psychiatric History: I have reviewed past psychiatric history from progress note on 12/28/2017.  Past trials of Zoloft  Past Medical History:  Past Medical History:  Diagnosis Date   Allergic rhinitis    Arthritis    BPH (benign prostatic hyperplasia)    started on saw palmetto ([facility] Dr. Yves Dill)   Cervicalgia    Environmental allergies    Irritable bowel syndrome    OSA (obstructive sleep apnea) 2022   Pain in joint, lower leg    Patellar tendinitis    Polyp of nasal cavity    Restless leg     Past Surgical History:  Procedure Laterality Date   IRRIGATION AND DEBRIDEMENT SEBACEOUS CYST     KNEE SURGERY Bilateral    TONSILLECTOMY     VASECTOMY  2009    Family Psychiatric History: I have reviewed family psychiatric history from progress note from 12/28/2017.  Family History:  Family History  Problem Relation Age of Onset   Hyperlipidemia Father    Cardiomyopathy Father        hypertrophic   Irritable bowel syndrome Father    Alcohol abuse Father    Goiter Mother    Mitral valve prolapse Sister    Thyroid disease Other        maternal side   Coronary artery disease Other        paternal side (coal miners)   Crohn's disease Other        uncle and cousin   Crohn's disease  Paternal Uncle    Alcohol abuse Paternal Uncle    Alcohol abuse Cousin    Anxiety disorder Cousin    Cancer Neg Hx     Social History: Reviewed social history from progress note on 12/28/2017. Social History   Socioeconomic History   Marital status: Legally Separated    Spouse name: Shane [name]   Number of children: 2   Years of education: Not on file   Highest education level: Bachelor's degree (e.g., BA, AB, BS)  Occupational History   Occupation: EMPLOYED as special ed teacher  Tobacco Use   Smoking status: Former    Types: Cigarettes    Quit date: 07/03/2003    Years since quitting: 17.9   Smokeless tobacco: Never  Vaping Use   Vaping Use: Never used  Substance and Sexual Activity    Alcohol use: Not Currently    Comment: Occasional   Drug use: No   Sexual activity: Yes  Other Topics Concern   Not on file  Social History Narrative   Not on file   Social Determinants of Health   Financial Resource Strain: Not on file  Food Insecurity: Not on file  Transportation Needs: Not on file  Physical Activity: Not on file  Stress: Not on file  Social Connections: Not on file    Allergies:  Allergies  Allergen Reactions   Penicillin G Swelling   Penicillins     REACTION: throat swelling    Metabolic Disorder Labs: No results found for: HGBA1C, MPG No results found for: PROLACTIN No results found for: CHOL, TRIG, HDL, CHOLHDL, VLDL, LDLCALC No results found for: TSH  Therapeutic Level Labs: No results found for: LITHIUM No results found for: VALPROATE No components found for:  CBMZ  Current Medications: Current Outpatient Medications  Medication Sig Dispense Refill   benzonatate (TESSALON) 100 MG capsule Take 1 capsule (100 mg total) by mouth 3 (three) times daily as needed for cough. 21 capsule 0   BINAXNOW COVID-19 AG HOME TEST KIT      cetirizine (ZYRTEC) 10 MG tablet Take by mouth.     finasteride (PROSCAR) 5 MG tablet Take by mouth.     fluticasone (FLONASE) 50 MCG/ACT nasal spray Place into the nose.     fluticasone (FLONASE) 50 MCG/ACT nasal spray SPRAY 2 SPRAYS INTO EACH NOSTRIL EVERY DAY     hydrOXYzine (VISTARIL) 25 MG capsule Take 1-2 capsules (25-50 mg total) by mouth at bedtime as needed for anxiety (sleep). 60 capsule 1   magnesium oxide (MAG-OX) 400 MG tablet Take 400 mg by mouth daily.     Melatonin 10 MG TABS Take by mouth.     Multiple Vitamin (MULTIVITAMIN) tablet Take 1 tablet by mouth daily.     omeprazole (PRILOSEC) 20 MG capsule Take by mouth.     omeprazole (PRILOSEC) 20 MG capsule Take 1 tablet by mouth daily.     pantoprazole (PROTONIX) 40 MG tablet   2   Saw Palmetto, Serenoa repens, (SAW PALMETTO PO) Take by mouth.      sertraline (ZOLOFT) 25 MG tablet Take 1 tablet (25 mg total) by mouth daily. 90 tablet 1   tamsulosin (FLOMAX) 0.4 MG CAPS capsule Take 0.8 mg by mouth daily.     zolpidem (AMBIEN) 5 MG tablet Take 0.5-1 tablets (2.5-5 mg total) by mouth at bedtime as needed for sleep. 15 tablet 3   No current facility-administered medications for this visit.     Musculoskeletal: Strength & Muscle Tone:  UTA  Gait & Station: normal Patient leans: N/A  Psychiatric Specialty Exam: Review of Systems  Psychiatric/Behavioral:  Positive for sleep disturbance.   All other systems reviewed and are negative.  There were no vitals taken for this visit.There is no height or weight on file to calculate BMI.  General Appearance: Casual  Eye Contact:  Fair  Speech:  Clear and Coherent  Volume:  Normal  Mood:  Euthymic  Affect:  Congruent  Thought Process:  Goal Directed and Descriptions of Associations: Intact  Orientation:  Full (Time, Place, and Person)  Thought Content: Logical   Suicidal Thoughts:  No  Homicidal Thoughts:  No  Memory:  Immediate;   Fair Recent;   Fair Remote;   Fair  Judgement:  Fair  Insight:  Fair  Psychomotor Activity:  Normal  Concentration:  Concentration: Fair and Attention Span: Fair  Recall:  [employer] of Knowledge: Fair  Language: Fair  Akathisia:  No  Handed:  Right  AIMS (if indicated): not done  Assets:  Communication Skills Desire for Improvement Social Support Talents/Skills Transportation Vocational/Educational  ADL's:  Intact  Cognition: WNL  Sleep:   Restless   Screenings: GAD-7    Flowsheet Row Video Visit from 02/16/2021 in [facility] Video Visit from 12/10/2020 in [facility] Video Visit from 10/12/2019 in [facility] Office Visit from 02/27/2019 in [facility]  Total GAD-7 Score _0 PHQ2-9    Flowsheet Row Video Visit from  05/27/2021 in [facility] Video Visit from 12/10/2020 in [facility] Video Visit from 08/15/2020 in [facility] Visit from 02/27/2019 in [facility]  PHQ-2 Total Score 0 0 0 1      [facility] from 03/17/2021 in [facility] at [facility]  Video Visit from 12/10/2020 in [facility] Video Visit from 08/15/2020 in [facility] No Risk No Risk No Risk        Assessment and Plan: Brannan Cassedy is a 45 year old Caucasian male, employed, veteran, divorced, lives in [place], has a history of PTSD, sleep problems, GERD was evaluated by telemedicine today.  Patient is currently stable except for his sleep which likely is due to obstructive sleep apnea.  Plan as noted below.  Plan PTSD-stable Continue reduced dosage of Zoloft, 25 mg p.o. daily   GAD-stable Zoloft 25 mg p.o. daily  OSA-unstable Patient is currently trying to get used to the CPAP. Patient does have Ambien 2.5-5 mg by mouth at bedtime as needed for sleep.   Follow-up in clinic in 3 months or sooner if needed.  This note was generated in part or whole with voice recognition software. Voice recognition is usually quite accurate but there are transcription errors that can and very often do occur. I apologize for any typographical errors that were not detected and corrected.      Ursula Alert, MD 05/28/2021, 10:50 AM
